# Patient Record
Sex: Female | Born: 1980 | Race: White | Hispanic: No | Marital: Married | Smoking: Never smoker
Health system: Southern US, Community
[De-identification: ages and names within clinical notes are randomized; demographics above are authoritative.]

## PROBLEM LIST (undated history)

## (undated) DIAGNOSIS — R112 Nausea with vomiting, unspecified: Secondary | ICD-10-CM

## (undated) DIAGNOSIS — T753XXA Motion sickness, initial encounter: Secondary | ICD-10-CM

## (undated) DIAGNOSIS — L409 Psoriasis, unspecified: Secondary | ICD-10-CM

## (undated) DIAGNOSIS — G4700 Insomnia, unspecified: Secondary | ICD-10-CM

## (undated) DIAGNOSIS — T7840XA Allergy, unspecified, initial encounter: Secondary | ICD-10-CM

## (undated) DIAGNOSIS — K219 Gastro-esophageal reflux disease without esophagitis: Secondary | ICD-10-CM

## (undated) DIAGNOSIS — Z9889 Other specified postprocedural states: Secondary | ICD-10-CM

## (undated) HISTORY — PX: TUBAL LIGATION: SHX77

## (undated) HISTORY — DX: Allergy, unspecified, initial encounter: T78.40XA

## (undated) HISTORY — DX: Psoriasis, unspecified: L40.9

## (undated) HISTORY — PX: APPENDECTOMY: SHX54

---

## 2004-08-05 ENCOUNTER — Other Ambulatory Visit: Admission: RE | Admit: 2004-08-05 | Discharge: 2004-08-05 | Payer: Self-pay | Admitting: Obstetrics and Gynecology

## 2005-06-04 ENCOUNTER — Other Ambulatory Visit: Admission: RE | Admit: 2005-06-04 | Discharge: 2005-06-04 | Payer: Self-pay | Admitting: Gynecology

## 2005-06-22 ENCOUNTER — Emergency Department: Payer: Self-pay | Admitting: General Practice

## 2005-12-25 ENCOUNTER — Inpatient Hospital Stay (HOSPITAL_COMMUNITY): Admission: AD | Admit: 2005-12-25 | Discharge: 2005-12-28 | Payer: Self-pay | Admitting: Gynecology

## 2006-02-03 ENCOUNTER — Other Ambulatory Visit: Admission: RE | Admit: 2006-02-03 | Discharge: 2006-02-03 | Payer: Self-pay | Admitting: Gynecology

## 2007-07-15 HISTORY — PX: COLONOSCOPY: SHX174

## 2010-07-14 DIAGNOSIS — O00202 Left ovarian pregnancy without intrauterine pregnancy: Secondary | ICD-10-CM

## 2010-07-14 HISTORY — PX: LAPAROSCOPIC BILATERAL SALPINGO OOPHERECTOMY: SHX5890

## 2010-07-14 HISTORY — PX: LAPAROSCOPIC BILATERAL SALPINGECTOMY: SHX5889

## 2010-07-14 HISTORY — DX: Left ovarian pregnancy without intrauterine pregnancy: O00.202

## 2013-07-14 HISTORY — PX: BREAST SURGERY: SHX581

## 2013-07-14 HISTORY — PX: OTHER SURGICAL HISTORY: SHX169

## 2013-07-14 HISTORY — PX: MASTOPEXY: SUR857

## 2013-07-14 HISTORY — PX: ABDOMINOPLASTY: SUR9

## 2015-08-31 ENCOUNTER — Other Ambulatory Visit: Payer: Self-pay | Admitting: Family Medicine

## 2015-08-31 DIAGNOSIS — Z1231 Encounter for screening mammogram for malignant neoplasm of breast: Secondary | ICD-10-CM

## 2015-09-26 ENCOUNTER — Ambulatory Visit: Payer: Self-pay

## 2015-09-26 ENCOUNTER — Ambulatory Visit
Admission: RE | Admit: 2015-09-26 | Discharge: 2015-09-26 | Disposition: A | Discharge status: Home / Self Care | Payer: Managed Care, Other (non HMO) | Source: Ambulatory Visit | Attending: Family Medicine | Admitting: Family Medicine

## 2015-09-26 DIAGNOSIS — Z1231 Encounter for screening mammogram for malignant neoplasm of breast: Secondary | ICD-10-CM | POA: Diagnosis not present

## 2015-09-26 IMAGING — MG MM DIGITAL SCREENING BILAT W/ CAD
4 series · 4 of 4 positions shown · non-contrast
Comparison: None.

CLINICAL DATA: Screening.

EXAM:
DIGITAL SCREENING BILATERAL MAMMOGRAM WITH CAD

[R CC]
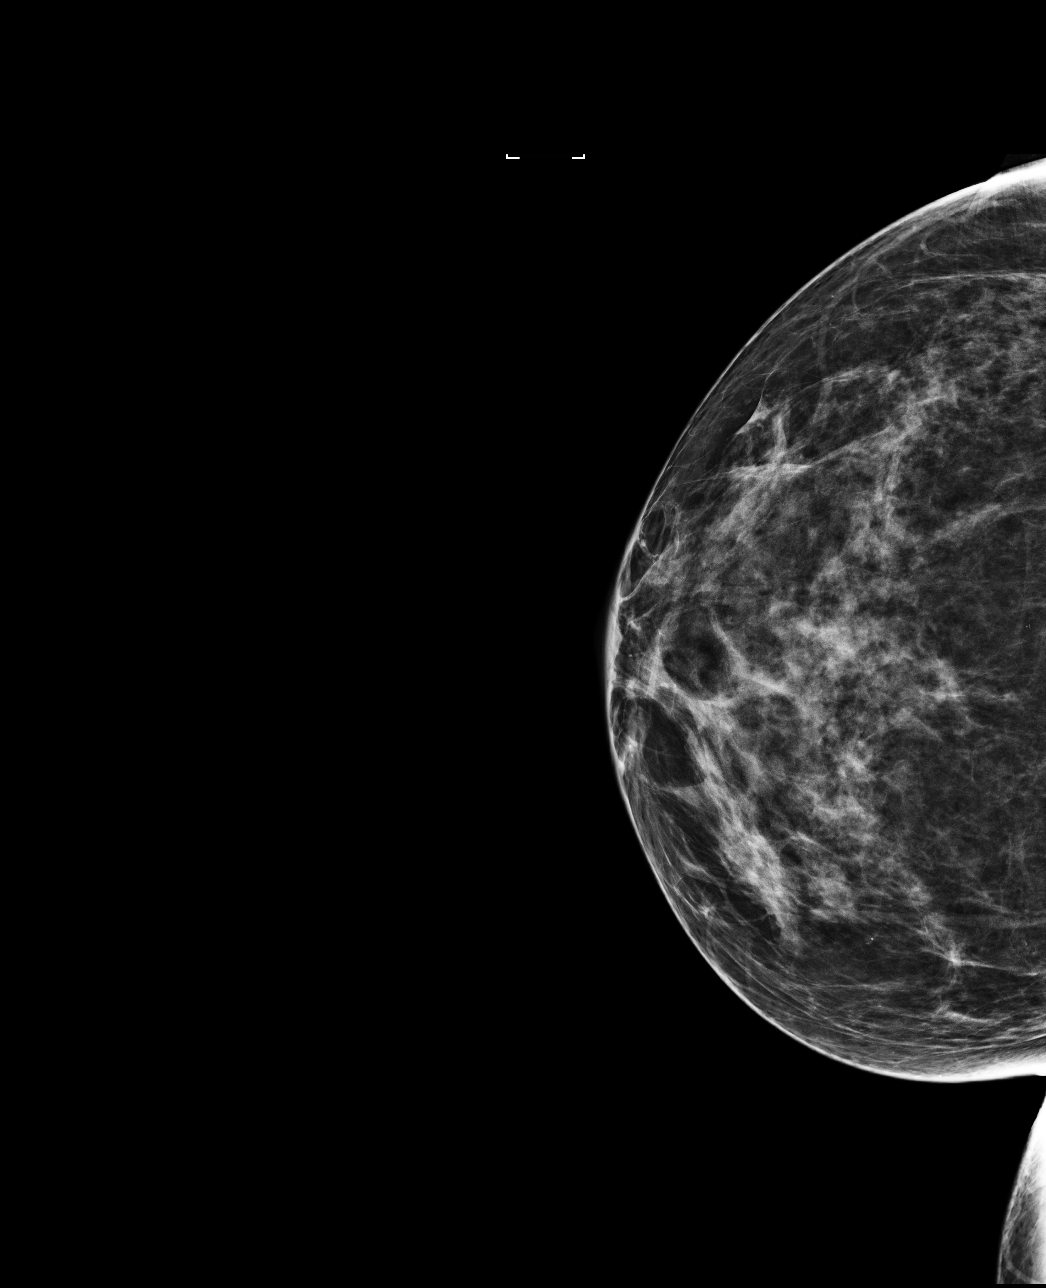

[R MLO]
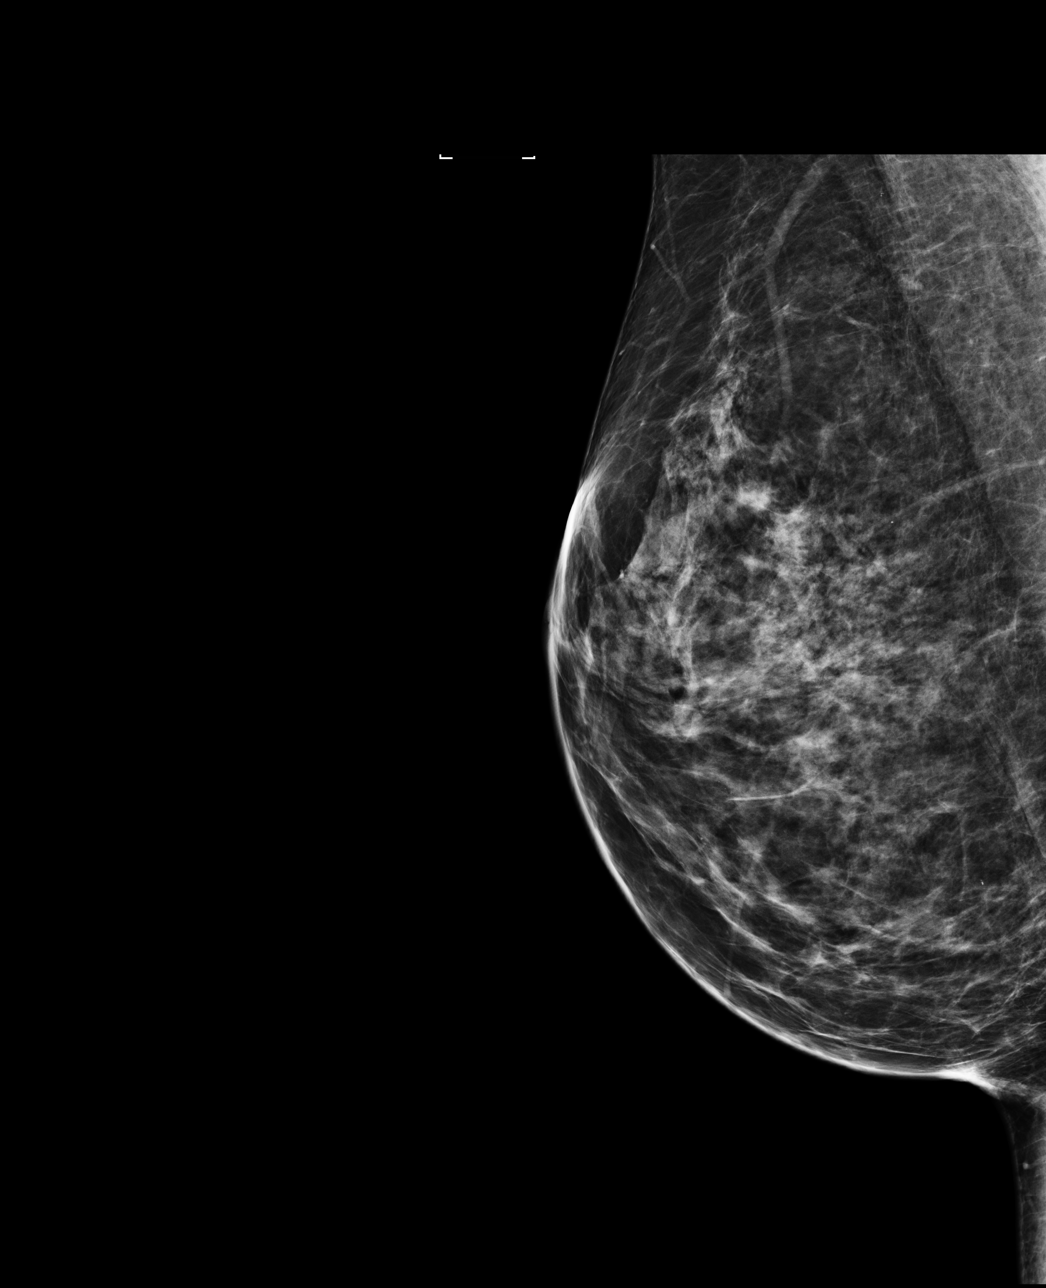

[L CC]
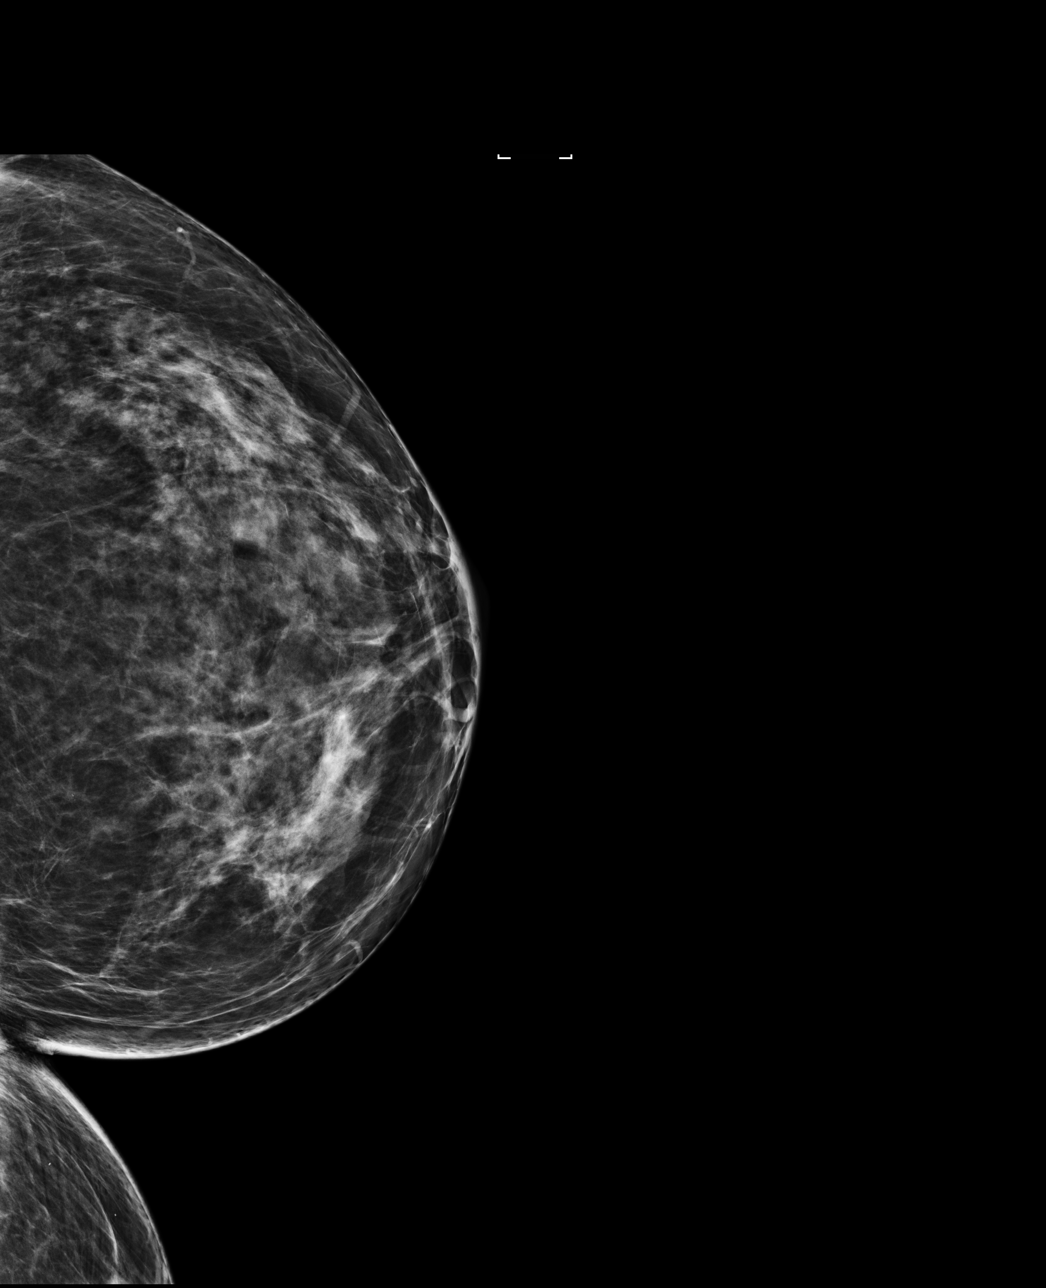

[L MLO]
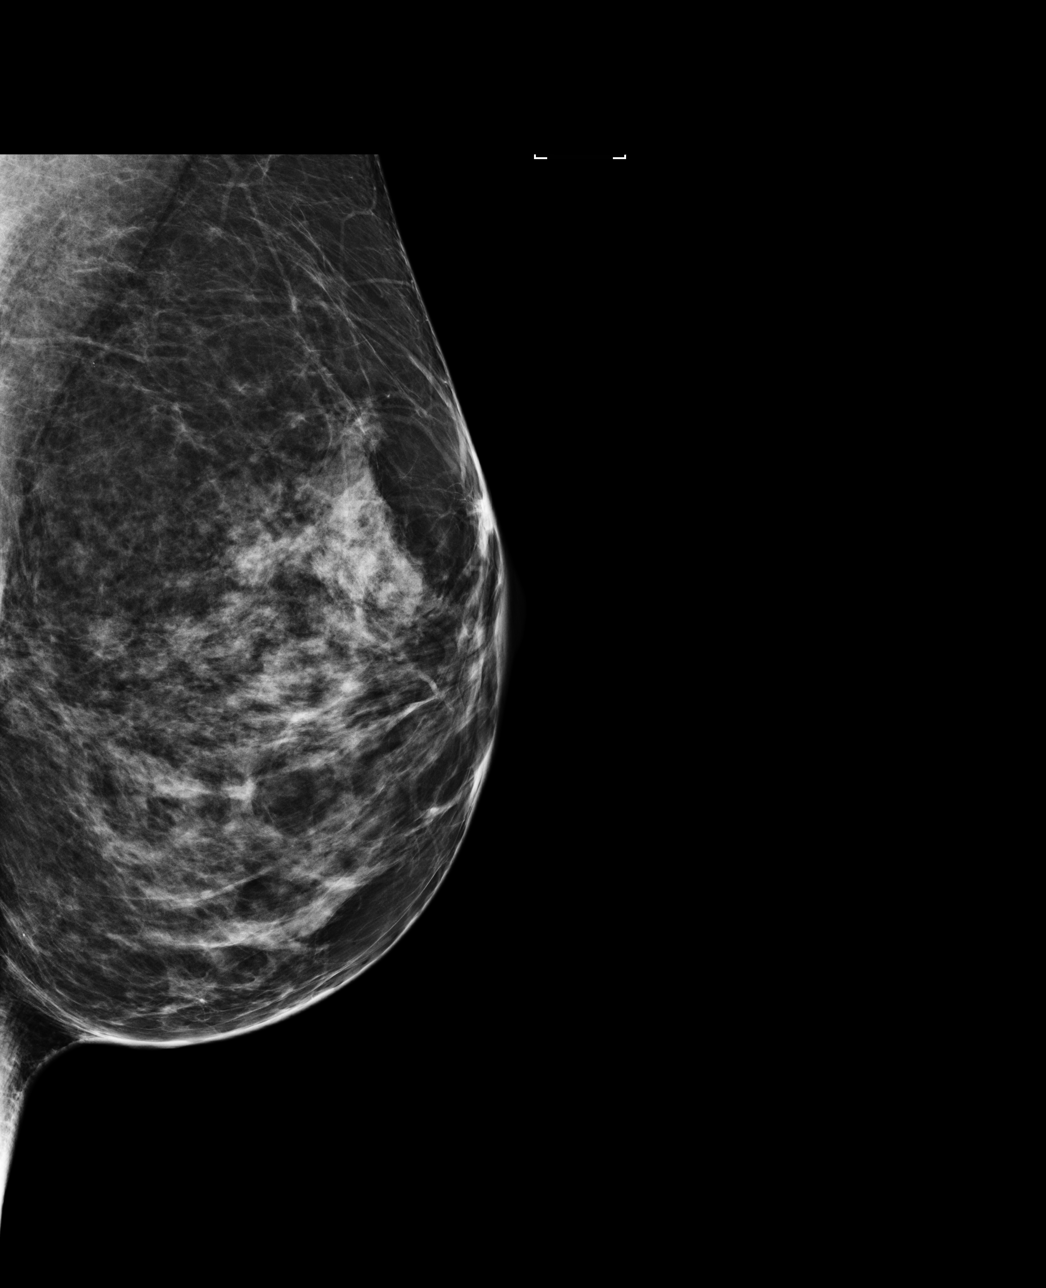

[4 of 4 positions shown; findings below may reference images not displayed]

ACR Breast Density Category b: There are scattered areas of
fibroglandular density.
FINDINGS: There are no findings suspicious for malignancy. Images were
processed with CAD.
IMPRESSION: No mammographic evidence of malignancy. A result letter of this
screening mammogram will be mailed directly to the patient.

RECOMMENDATION:
Screening mammogram at age 40. (Code:[UJ])

BI-RADS CATEGORY  1: Negative.

## 2017-05-13 ENCOUNTER — Ambulatory Visit: Payer: Self-pay | Admitting: Maternal Newborn

## 2017-06-02 NOTE — Progress Notes (Signed)
Gynecology Annual Exam  PCP: Patient, No Pcp Per  Chief Complaint:  Chief Complaint  Patient presents with  . Gynecologic Exam    discuss breakthrough bleeding  . Menorrhagia    History of Present Illness: Patient is a 36 y.o. No obstetric history on file. presents for annual exam. The patient has no complaints today.   LMP: Patient's last menstrual period was 05/15/2017. Average Interval: regular, 28 days Duration of flow: 7 days Heavy Menses: yes Clots: no Intermenstrual Bleeding: no Postcoital Bleeding: no Dysmenorrhea: no  The patient is sexually active. She currently uses tubal ligation for contraception. She denies dyspareunia.  The patient does perform self breast exams.  There is no notable family history of breast or ovarian cancer in her family.  The patient wears seatbelts: yes.   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    The patient denies current symptoms of depression.    Review of Systems: Review of Systems  Constitutional: Negative for chills and fever.  HENT: Negative for congestion.   Respiratory: Negative for cough and shortness of breath.   Cardiovascular: Negative for chest pain and palpitations.  Gastrointestinal: Negative for abdominal pain, constipation, diarrhea, heartburn, nausea and vomiting.  Genitourinary: Negative for dysuria, frequency and urgency.  Skin: Negative for itching and rash.  Neurological: Negative for dizziness and headaches.  Endo/Heme/Allergies: Negative for polydipsia.  Psychiatric/Behavioral: Negative for depression.    Past Medical History:  Past Medical History:  Diagnosis Date  . Psoriasis     Past Surgical History:  Past Surgical History:  Procedure Laterality Date  . APPENDECTOMY    . BREAST SURGERY Bilateral 2015   breast lift  . CESAREAN SECTION  2007, 2009, 2011  . LAPAROSCOPIC BILATERAL SALPINGO OOPHERECTOMY  2012  . TUBAL LIGATION    . tummy tuck  2015     Gynecologic History:  Patient's last menstrual period was 05/15/2017. Contraception: tubal ligation Last Pap: Results were:11/28/2015 NIL and HR HPV negative   Obstetric History: No obstetric history on file.  Family History:  Family History  Problem Relation Age of Onset  . Breast cancer Maternal Aunt 50    Social History:  Social History   Socioeconomic History  . Marital status: Married    Spouse name: Not on file  . Number of children: Not on file  . Years of education: Not on file  . Highest education level: Not on file  Social Needs  . Financial resource strain: Not on file  . Food insecurity - worry: Not on file  . Food insecurity - inability: Not on file  . Transportation needs - medical: Not on file  . Transportation needs - non-medical: Not on file  Occupational History  . Not on file  Tobacco Use  . Smoking status: Never Smoker  . Smokeless tobacco: Never Used  Substance and Sexual Activity  . Alcohol use: Yes  . Drug use: No  . Sexual activity: Yes    Partners: Male    Birth control/protection: None  Other Topics Concern  . Not on file  Social History Narrative  . Not on file    Allergies:  Allergies no known allergies  Medications:  Current Outpatient Medications:  .  CLOBEX 0.05 % shampoo, USE SHAMPOO AS DIRECTED, Disp: , Rfl: 6 .  fluocinonide (LIDEX) 0.05 % external solution, APPLY TWICE DAILY TO AFFECTED AREAS ON SCALP AS NEEDED., Disp: , Rfl:  .  ISOtretinoin (MYORISAN) 40 MG capsule, ,  Disp: , Rfl:  .  meloxicam (MOBIC) 15 MG tablet, Take by mouth., Disp: , Rfl:  .  omeprazole (PRILOSEC) 20 MG capsule, Take by mouth., Disp: , Rfl:   Physical Exam Blood pressure 110/66, pulse 84, height 5\' 4"  (1.626 m), weight 166 lb (75.3 kg), last menstrual period 05/15/2017.  General: NAD HEENT: normocephalic, anicteric Thyroid: no enlargement, no palpable nodules Pulmonary: No increased work of breathing, CTAB Cardiovascular: RRR, distal  pulses 2+ Breast: Breast symmetrical, no tenderness, no palpable nodules or masses, no skin or nipple retraction present, no nipple discharge.  No axillary or supraclavicular lymphadenopathy. Abdomen: NABS, soft, non-tender, non-distended.  Umbilicus without lesions.  No hepatomegaly, splenomegaly or masses palpable. No evidence of hernia  Genitourinary:  External: Normal external female genitalia.  Normal urethral meatus, normal  Bartholin's and Skene's glands.    Vagina: Normal vaginal mucosa, no evidence of prolapse.    Cervix: Grossly normal in appearance, no bleeding  Uterus: Non-enlarged, mobile, normal contour.  No CMT  Adnexa: ovaries non-enlarged, no adnexal masses  Rectal: deferred  Lymphatic: no evidence of inguinal lymphadenopathy Extremities: no edema, erythema, or tenderness Neurologic: Grossly intact Psychiatric: mood appropriate, affect full  Female chaperone present for pelvic and breast  portions of the physical exam    Assessment: 36 y.o. No obstetric history on file. routine annual exam  Plan: Problem List Items Addressed This Visit    None    Visit Diagnoses    Screening for malignant neoplasm of cervix    -  Primary   Relevant Orders   PapIG, HPV, rfx 16/18   Breast screening       Encounter for gynecological examination without abnormal finding          1) STI screening was offered and declined  2) ASCCP guidelines and rational discussed.  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

## 2017-06-03 ENCOUNTER — Encounter: Payer: Self-pay | Admitting: Obstetrics and Gynecology

## 2017-06-03 ENCOUNTER — Ambulatory Visit (INDEPENDENT_AMBULATORY_CARE_PROVIDER_SITE_OTHER): Payer: BC Managed Care – PPO | Admitting: Obstetrics and Gynecology

## 2017-06-03 VITALS — BP 110/66 | HR 84 | Ht 64.0 in | Wt 166.0 lb

## 2017-06-03 DIAGNOSIS — Z1231 Encounter for screening mammogram for malignant neoplasm of breast: Secondary | ICD-10-CM

## 2017-06-03 DIAGNOSIS — Z124 Encounter for screening for malignant neoplasm of cervix: Secondary | ICD-10-CM

## 2017-06-03 DIAGNOSIS — Z1239 Encounter for other screening for malignant neoplasm of breast: Secondary | ICD-10-CM

## 2017-06-03 DIAGNOSIS — Z01419 Encounter for gynecological examination (general) (routine) without abnormal findings: Secondary | ICD-10-CM | POA: Diagnosis not present

## 2017-06-03 NOTE — Patient Instructions (Signed)
The patient was counseled on the overall effectiveness of novasure endometrial ablation in achieving amenorrhea.  She is aware that some patient may continue to have menstrual cycles although these are generally greatly reduced in flow.  In addition she was quoted a failure rate for endometrial ablation of approximately 25% within the frist 4 years, but these failures may happen at any time during or after the initial 4 year postop period.  She is aware that pregnancy is contra-indicated in the setting of prior endometrial ablation, and that ablation itself does not confer any contraceptive benefit.  She will therefore need to continue to rely on some means of contraception following the procedure.  Although rare and generally confined to patient who have undergone prior tubal ligatoin, post-ablation tubal sterilizaton syndrome (PATSS) may also occur with no reliable incidence rates as the majority of published literature is limited to case reports.   Prior to being considered a candidate for Novasure ablation she will need to undergo endometrial biopsy to rule out endometrial hyperplasia or malignancy as the cause of her bleeding, have an up to date pap on record, and undergo transvaginal ultrasound to verify the absence of focal endometrial lesion which may need to be addressed prior to proceeding with ablation.  In addition she is aware that the device is limited for use in women with a normal uterine cavity and uterine leiomyomata <3cm in size.  If present leiomyomata may increase the long-term failure rate of the procedure.  In rare instances the presence of a uterine septum or arcuate uterus, which may not be readily apparent on preoperative ultrasound, may necessitate the procedure to be aborted.    Risks and benefits of IUD discussed including the risks of irregular bleeding, cramping, infection, malpositioning, expulsion or uterine perforation of the IUD (1:1000 placements)  which may require further  procedures such as laparoscopy.  IUDs while effective at preventing pregnancy do not prevent transmission of sexually transmitted diseases and use of barrier methods for this purpose was discussed.  Low overall incidence of failure with 99.7% efficacy rate in typical use.  The patient has not contraindication to IUD placement.

## 2017-06-05 LAB — PAPIG, HPV, RFX 16/18
HPV, high-risk: NEGATIVE
PAP SMEAR COMMENT: 0

## 2017-06-08 DIAGNOSIS — M7581 Other shoulder lesions, right shoulder: Secondary | ICD-10-CM | POA: Insufficient documentation

## 2017-06-08 DIAGNOSIS — S46911A Strain of unspecified muscle, fascia and tendon at shoulder and upper arm level, right arm, initial encounter: Secondary | ICD-10-CM | POA: Insufficient documentation

## 2017-06-08 DIAGNOSIS — S43431A Superior glenoid labrum lesion of right shoulder, initial encounter: Secondary | ICD-10-CM | POA: Insufficient documentation

## 2017-06-12 ENCOUNTER — Other Ambulatory Visit: Payer: Self-pay | Admitting: Surgery

## 2017-06-12 DIAGNOSIS — S46911D Strain of unspecified muscle, fascia and tendon at shoulder and upper arm level, right arm, subsequent encounter: Secondary | ICD-10-CM

## 2017-06-12 DIAGNOSIS — S43431A Superior glenoid labrum lesion of right shoulder, initial encounter: Secondary | ICD-10-CM

## 2017-06-26 ENCOUNTER — Ambulatory Visit: Payer: Managed Care, Other (non HMO)

## 2017-06-26 ENCOUNTER — Ambulatory Visit: Admission: RE | Admit: 2017-06-26 | Payer: BC Managed Care – PPO | Source: Ambulatory Visit

## 2017-06-26 ENCOUNTER — Ambulatory Visit: Payer: BC Managed Care – PPO

## 2017-08-06 ENCOUNTER — Telehealth: Payer: Self-pay

## 2017-08-06 NOTE — Telephone Encounter (Signed)
Pt aware AMS will return to office tomorrow. I advised her I do not see in Epic, or [facility], a rx has ever been written for Diflucan. She states he told her if she ever needed it he would call it in. Pt aware I will send him a message but he still may want her to be seen.

## 2017-08-06 NOTE — Telephone Encounter (Signed)
Valerie Valdez, please advise on this. Pt's last appointment was 05/2017 Thanks

## 2017-08-06 NOTE — Telephone Encounter (Signed)
Called and spoke with Patient about scheduling a medication follow up with Provider. Pt is requesting to speak with a Nurse. Pt states she wa told that Dr. Georgianne Fick told her to let him know when she needs the Diflucan refill to let him know, Please advise. Pt refuses to schedule appt.

## 2017-08-06 NOTE — Telephone Encounter (Signed)
Valerie Valdez please call patient and get her scheduled with a provider, or she can go to urgent care/pcp. AMS is out of the office today.

## 2017-08-06 NOTE — Telephone Encounter (Signed)
Pt would like AMS to call in diflucan to [facility] on [address].  [phone]

## 2017-08-06 NOTE — Telephone Encounter (Signed)
AMS is in office tomorrow Am. Notify pt you will discuss with AMS tomorrow.

## 2017-08-07 ENCOUNTER — Other Ambulatory Visit: Payer: Self-pay | Admitting: Obstetrics and Gynecology

## 2017-08-07 MED ORDER — FLUCONAZOLE 150 MG PO TABS: 150.0000 mg | ORAL_TABLET | Freq: Once | ORAL | 0 refills | Status: AC

## 2017-08-07 NOTE — Telephone Encounter (Signed)
One time dose called in if symptoms persist to come by office for eval

## 2019-05-19 ENCOUNTER — Other Ambulatory Visit: Payer: Self-pay

## 2019-05-19 DIAGNOSIS — Z20822 Contact with and (suspected) exposure to covid-19: Secondary | ICD-10-CM

## 2019-05-21 LAB — NOVEL CORONAVIRUS, NAA: SARS-CoV-2, NAA: NOT DETECTED

## 2019-05-26 ENCOUNTER — Other Ambulatory Visit: Payer: Self-pay

## 2019-05-26 DIAGNOSIS — Z20822 Contact with and (suspected) exposure to covid-19: Secondary | ICD-10-CM

## 2019-05-28 LAB — NOVEL CORONAVIRUS, NAA: SARS-CoV-2, NAA: NOT DETECTED

## 2019-09-09 ENCOUNTER — Other Ambulatory Visit: Payer: Self-pay

## 2019-09-09 ENCOUNTER — Ambulatory Visit: Payer: BC Managed Care – PPO | Attending: Internal Medicine

## 2019-09-09 DIAGNOSIS — Z23 Encounter for immunization: Secondary | ICD-10-CM | POA: Insufficient documentation

## 2019-09-09 NOTE — Progress Notes (Signed)
   Covid-19 Vaccination Clinic  Name:  Valerie Valdez    MRN: [id number] DOB: 19-Aug-1980  09/09/2019  [name] was observed post Covid-19 immunization for 15 minutes without incidence. She was provided with Vaccine Information Sheet and instruction to access the V-Safe system.   [name] was instructed to call 911 with any severe reactions post vaccine: Marland Kitchen Difficulty breathing  . Swelling of your face and throat  . A fast heartbeat  . A bad rash all over your body  . Dizziness and weakness    Immunizations Administered    Name Date Dose VIS Date Route   Pfizer COVID-19 Vaccine 09/09/2019 10:07 AM 0.3 mL 06/24/2019 Intramuscular   Manufacturer: [place]   Lot: [id number]   [place]: [id number]

## 2019-10-04 ENCOUNTER — Ambulatory Visit: Payer: BC Managed Care – PPO | Attending: Internal Medicine

## 2019-10-04 DIAGNOSIS — Z23 Encounter for immunization: Secondary | ICD-10-CM

## 2019-10-04 NOTE — Progress Notes (Signed)
   Covid-19 Vaccination Clinic  Name:  Valerie Valdez    MRN: [id number] DOB: [birth year]  10/04/2019  Ms. Loughman was observed post Covid-19 immunization for 15 minutes without incident. She was provided with Vaccine Information Sheet and instruction to access the V-Safe system.   Ms. Gromley was instructed to call 911 with any severe reactions post vaccine: Marland Kitchen Difficulty breathing  . Swelling of face and throat  . A fast heartbeat  . A bad rash all over body  . Dizziness and weakness   Immunizations Administered    Name Date Dose VIS Date Route   Pfizer COVID-19 Vaccine 10/04/2019  2:46 PM 0.3 mL 06/24/2019 Intramuscular   Manufacturer: [place]   Lot: [id number]   [place]: [id number]

## 2020-02-07 ENCOUNTER — Telehealth: Payer: Self-pay

## 2020-02-07 NOTE — Telephone Encounter (Signed)
Pt calling; has appt on 02/22/20; having pelvic pain that keeps getting worse.  What can she do for the pain in the meantime?  [id number] Adv 600mg  IBU q6h or 800mg  q8h; heating pad 38min qhr while awake or may try cool compress.  Pt will call back if doesn't help.  Pt would like to be put on the cancellation list.  Will send to front desk.

## 2020-02-07 NOTE — Telephone Encounter (Signed)
Patient added to cancellation list.

## 2020-02-22 ENCOUNTER — Other Ambulatory Visit: Payer: Self-pay

## 2020-02-22 ENCOUNTER — Encounter: Payer: Self-pay | Admitting: Obstetrics and Gynecology

## 2020-02-22 ENCOUNTER — Ambulatory Visit (INDEPENDENT_AMBULATORY_CARE_PROVIDER_SITE_OTHER): Payer: BC Managed Care – PPO | Admitting: Obstetrics and Gynecology

## 2020-02-22 ENCOUNTER — Other Ambulatory Visit (HOSPITAL_COMMUNITY)
Admission: RE | Admit: 2020-02-22 | Discharge: 2020-02-22 | Disposition: A | Discharge status: Home / Self Care | Payer: BC Managed Care – PPO | Source: Ambulatory Visit | Attending: Obstetrics and Gynecology | Admitting: Obstetrics and Gynecology

## 2020-02-22 VITALS — BP 121/81 | HR 71 | Ht 64.0 in | Wt 170.0 lb

## 2020-02-22 DIAGNOSIS — Z01419 Encounter for gynecological examination (general) (routine) without abnormal findings: Secondary | ICD-10-CM

## 2020-02-22 DIAGNOSIS — Z124 Encounter for screening for malignant neoplasm of cervix: Secondary | ICD-10-CM | POA: Diagnosis not present

## 2020-02-22 DIAGNOSIS — N939 Abnormal uterine and vaginal bleeding, unspecified: Secondary | ICD-10-CM | POA: Insufficient documentation

## 2020-02-22 DIAGNOSIS — Z1239 Encounter for other screening for malignant neoplasm of breast: Secondary | ICD-10-CM

## 2020-02-22 NOTE — Progress Notes (Signed)
Gynecology Annual Exam   PCP: Maryland Pink, MD  Chief Complaint:  Chief Complaint  Patient presents with  . Gynecologic Exam    breakthrough bleeding, discuss possible ablation    History of Present Illness: Patient is a 39 y.o. [id number] presents for annual exam. The patient has no complaints today.   LMP: Patient's last menstrual period was 02/17/2020. Average Interval: regular, 28 days Duration of flow: 5 days Heavy Menses: no Clots: no Intermenstrual Bleeding: yes consistent midcycle bleeding Postcoital Bleeding: no Dysmenorrhea: no  The patient is sexually active. She currently uses tubal ligation for contraception. She denies dyspareunia.  The patient does perform self breast exams.  There is no notable family history of breast or ovarian cancer in her family.  The patient wears seatbelts: yes.   The patient has regular exercise: not asked.    The patient denies current symptoms of depression.    Review of Systems: Review of Systems  Constitutional: Negative for chills and fever.  HENT: Negative for congestion.   Respiratory: Negative for cough and shortness of breath.   Cardiovascular: Negative for chest pain and palpitations.  Gastrointestinal: Negative for abdominal pain, constipation, diarrhea, heartburn, nausea and vomiting.  Genitourinary: Negative for dysuria, frequency and urgency.  Skin: Negative for itching and rash.  Neurological: Negative for dizziness and headaches.  Endo/Heme/Allergies: Negative for polydipsia.  Psychiatric/Behavioral: Negative for depression.    Past Medical History:  There are no problems to display for this patient.   Past Surgical History:  Past Surgical History:  Procedure Laterality Date  . APPENDECTOMY    . BREAST SURGERY Bilateral 2015   breast lift  . CESAREAN SECTION  2007, 2009, 2011  . LAPAROSCOPIC BILATERAL SALPINGO OOPHERECTOMY  2012  . TUBAL LIGATION    . tummy tuck  2015    Gynecologic History:    Patient's last menstrual period was 02/17/2020. Contraception: tubal ligation Last Pap: Results were:06/03/2017 NIL and HR HPV negative   Obstetric History: [id number]  Family History:  Family History  Problem Relation Age of Onset  . Breast cancer Maternal Aunt 50    Social History:  Social History   Socioeconomic History  . Marital status: Married    Spouse name: Not on file  . Number of children: Not on file  . Years of education: Not on file  . Highest education level: Not on file  Occupational History  . Not on file  Tobacco Use  . Smoking status: Never Smoker  . Smokeless tobacco: Never Used  Vaping Use  . Vaping Use: Never used  Substance and Sexual Activity  . Alcohol use: Yes    Comment: Occ  . Drug use: No  . Sexual activity: Yes    Partners: Male    Birth control/protection: None  Other Topics Concern  . Not on file  Social History Narrative  . Not on file   Social Determinants of Health   Financial Resource Strain:   . Difficulty of Paying Living Expenses:   Food Insecurity:   . Worried About Charity fundraiser in the Last Year:   . Arboriculturist in the Last Year:   Transportation Needs:   . Film/video editor (Medical):   Marland Kitchen Lack of Transportation (Non-Medical):   Physical Activity:   . Days of Exercise per Week:   . Minutes of Exercise per Session:   Stress:   . Feeling of Stress :   Social Connections:   . Frequency  of Communication with Friends and Family:   . Frequency of Social Gatherings with Friends and Family:   . Attends Religious Services:   . Active Member of Clubs or Organizations:   . Attends Archivist Meetings:   Marland Kitchen Marital Status:   Intimate Partner Violence:   . Fear of Current or Ex-Partner:   . Emotionally Abused:   Marland Kitchen Physically Abused:   . Sexually Abused:     Allergies:  No Known Allergies  Medications: Prior to Admission medications   Medication Sig Start Date End Date Taking? Authorizing  Provider  Clobetasol Propionate (TEMOVATE) 0.05 % external spray APPLY NIGHTLY TO SCALP 08/15/19  Yes [provider]  Clobetasol Propionate 0.05 % shampoo Use as directed as shampoo 01/15/18  Yes [provider]  CLOBEX 0.05 % shampoo USE SHAMPOO AS DIRECTED 05/25/17  Yes [provider]  doxycycline (VIBRAMYCIN) 100 MG capsule Take 100 mg by mouth daily. 08/28/19  Yes [provider]  fluocinonide (LIDEX) 0.05 % external solution APPLY TWICE DAILY TO AFFECTED AREAS ON SCALP AS NEEDED. 04/29/17  Yes [provider]  ISOtretinoin (MYORISAN) 40 MG capsule  05/19/17  Yes [provider]  ketoconazole (NIZORAL) 2 % shampoo USE AS SHAMPOO EVERY OTHER DAY. LEAVE ON FOR 5 MINUTES BEFORE RINSING. 04/13/18  Yes [provider]  traZODone (DESYREL) 50 MG tablet TAKE 1 TABLET BY MOUTH EVERY DAY AT NIGHT 02/07/20  Yes [provider]  omeprazole (PRILOSEC) 20 MG capsule Take by mouth. 09/09/16 09/09/17  [provider]    Physical Exam Vitals: Blood pressure 121/81, pulse 71, height 5\' 4"  (1.626 m), weight 170 lb (77.1 kg), last menstrual period 02/17/2020.  General: NAD, well nourished, appears stated age 79: normocephalic, anicteric Thyroid: no enlargement, no palpable nodules Pulmonary: No increased work of breathing, CTAB Cardiovascular: RRR, distal pulses 2+ Breast: Breast symmetrical, no tenderness, no palpable nodules or masses, no skin or nipple retraction present, no nipple discharge.  No axillary or supraclavicular lymphadenopathy. Abdomen: NABS, soft, non-tender, non-distended.  Umbilicus without lesions.  No hepatomegaly, splenomegaly or masses palpable. No evidence of hernia  Genitourinary:  External: Normal external female genitalia.  Normal urethral meatus, normal Bartholin's and Skene's glands.    Vagina: Normal vaginal mucosa, no evidence of prolapse.    Cervix: Grossly normal in appearance, no bleeding  Uterus:  Non-enlarged, mobile, normal contour.  No CMT  Adnexa: ovaries non-enlarged, no adnexal masses  Rectal: deferred  Lymphatic: no evidence of inguinal lymphadenopathy Extremities: no edema, erythema, or tenderness Neurologic: Grossly intact Psychiatric: mood appropriate, affect full  Female chaperone present for pelvic and breast  portions of the physical exam  Immunization History  Administered Date(s) Administered  . Influenza-Unspecified 05/27/2017  . PFIZER SARS-COV-2 Vaccination 09/09/2019, 10/04/2019     Assessment: 39 y.o. G4P0013 routine annual exam  Plan: Problem List Items Addressed This Visit    None    Visit Diagnoses    Abnormal uterine bleeding    -  Primary   Relevant Orders   Cytology - PAP   US Transvaginal Non-OB   Encounter for gynecological examination without abnormal finding       Screening for malignant neoplasm of cervix       Breast screening          1) STI screening  was notoffered and therefore not obtained  2)  ASCCP guidelines and rational discussed.  Patient opts for every 3 years screening interval  3) Contraception - the patient  is currently using  tubal ligation.  She is happy with her current form of contraception and plans to continue  4) Routine healthcare maintenance including cholesterol, diabetes screening discussed managed by PCP  5) AUB - TVUS ordered, endometrial biopsy with ultrasound if no focal lesion.  Patient is interested in Novasure abaltion.  Discussed Mirena as an alternative and the fact that should she be unresponsive to Mirena can proceed with ablation but the opposite is not the case as the ablation generally precludes ability to place IUD.  She would like to avoid hormones if at all possible.  6)  Return in about 1 week (around 02/29/2020) for 1-2 week TVUS and endometrial biopsy.   Malachy Mood, MD, [facility], [facility] 02/22/2020, 12:22 PM

## 2020-02-24 LAB — CYTOLOGY - PAP
Comment: NEGATIVE
Diagnosis: UNDETERMINED — AB
High risk HPV: NEGATIVE

## 2020-03-12 ENCOUNTER — Encounter: Payer: Self-pay | Admitting: Obstetrics and Gynecology

## 2020-03-12 ENCOUNTER — Ambulatory Visit (INDEPENDENT_AMBULATORY_CARE_PROVIDER_SITE_OTHER): Payer: BC Managed Care – PPO | Admitting: Obstetrics and Gynecology

## 2020-03-12 ENCOUNTER — Other Ambulatory Visit: Payer: Self-pay

## 2020-03-12 ENCOUNTER — Ambulatory Visit (INDEPENDENT_AMBULATORY_CARE_PROVIDER_SITE_OTHER): Payer: BC Managed Care – PPO

## 2020-03-12 VITALS — BP 120/84 | HR 54 | Ht 64.0 in | Wt 171.0 lb

## 2020-03-12 DIAGNOSIS — N939 Abnormal uterine and vaginal bleeding, unspecified: Secondary | ICD-10-CM

## 2020-03-12 NOTE — Progress Notes (Signed)
Gynecology Ultrasound Follow Up  Chief Complaint:  Chief Complaint  Patient presents with  . Follow-up    GYN ultrasound     History of Present Illness: Patient is a 39 y.o. female who presents today for ultrasound evaluation of AUB  Ultrasound demonstrates the following findgins Adnexa: no masses seen  Uterus: Enlarged with multiple uterine fibroids with endometrial stripe appears homogenous without evidence of focal endometrial lesion Additional: no free fluid  Review of Systems: Review of Systems  Constitutional: Negative.   Gastrointestinal: Negative.   Genitourinary: Negative.     Past Medical History:  Past Medical History:  Diagnosis Date  . Ectopic pregnancy of left ovary 2012  . Psoriasis     Past Surgical History:  Past Surgical History:  Procedure Laterality Date  . APPENDECTOMY    . BREAST SURGERY Bilateral 2015   breast lift  . CESAREAN SECTION  2007, 2009, 2011  . LAPAROSCOPIC BILATERAL SALPINGO OOPHERECTOMY  2012  . TUBAL LIGATION    . tummy tuck  2015    Gynecologic History:  Patient's last menstrual period was 02/17/2020. Contraception: tubal ligation Last Pap: 02/22/2020 Results were: .ASCUS with NEGATIVE high risk HPV  Family History:  Family History  Problem Relation Age of Onset  . Breast cancer Maternal Aunt 50    Social History:  Social History   Socioeconomic History  . Marital status: Married    Spouse name: Not on file  . Number of children: Not on file  . Years of education: Not on file  . Highest education level: Not on file  Occupational History  . Not on file  Tobacco Use  . Smoking status: Never Smoker  . Smokeless tobacco: Never Used  Vaping Use  . Vaping Use: Never used  Substance and Sexual Activity  . Alcohol use: Yes    Comment: Occ  . Drug use: No  . Sexual activity: Yes    Partners: Male    Birth control/protection: None  Other Topics Concern  . Not on file  Social History Narrative  . Not on file    Social Determinants of Health   Financial Resource Strain:   . Difficulty of Paying Living Expenses: Not on file  Food Insecurity:   . Worried About Charity fundraiser in the Last Year: Not on file  . Ran Out of Food in the Last Year: Not on file  Transportation Needs:   . Lack of Transportation (Medical): Not on file  . Lack of Transportation (Non-Medical): Not on file  Physical Activity:   . Days of Exercise per Week: Not on file  . Minutes of Exercise per Session: Not on file  Stress:   . Feeling of Stress : Not on file  Social Connections:   . Frequency of Communication with Friends and Family: Not on file  . Frequency of Social Gatherings with Friends and Family: Not on file  . Attends Religious Services: Not on file  . Active Member of Clubs or Organizations: Not on file  . Attends Archivist Meetings: Not on file  . Marital Status: Not on file  Intimate Partner Violence:   . Fear of Current or Ex-Partner: Not on file  . Emotionally Abused: Not on file  . Physically Abused: Not on file  . Sexually Abused: Not on file    Allergies:  No Known Allergies  Medications: Prior to Admission medications   Medication Sig Start Date End Date Taking? Authorizing Provider  Clobetasol  Propionate (TEMOVATE) 0.05 % external spray APPLY NIGHTLY TO SCALP 08/15/19  Yes [provider]  Clobetasol Propionate 0.05 % shampoo Use as directed as shampoo 01/15/18  Yes [provider]  CLOBEX 0.05 % shampoo USE SHAMPOO AS DIRECTED 05/25/17  Yes [provider]  doxycycline (VIBRAMYCIN) 100 MG capsule Take 100 mg by mouth daily. 08/28/19  Yes [provider]  fluocinonide (LIDEX) 0.05 % external solution APPLY TWICE DAILY TO AFFECTED AREAS ON SCALP AS NEEDED. 04/29/17  Yes [provider]  ISOtretinoin (MYORISAN) 40 MG capsule  05/19/17  Yes [provider]  ketoconazole (NIZORAL) 2 % shampoo USE AS SHAMPOO EVERY OTHER DAY. LEAVE ON  FOR 5 MINUTES BEFORE RINSING. 04/13/18  Yes [provider]  traZODone (DESYREL) 50 MG tablet TAKE 1 TABLET BY MOUTH EVERY DAY AT NIGHT 02/07/20  Yes [provider]  omeprazole (PRILOSEC) 20 MG capsule Take by mouth. 09/09/16 09/09/17  [provider]    Physical Exam Vitals: Blood pressure 120/84, pulse (!) 54, height 5\' 4"  (1.626 m), weight 171 lb (77.6 kg), last menstrual period 02/17/2020.  General: NAD HEENT: normocephalic, anicteric Pulmonary: No increased work of breathing Extremities: no edema, erythema, or tenderness Neurologic: Grossly intact, normal gait Psychiatric: mood appropriate, affect full   Assessment: 39 y.o. [id number] No problem-specific Assessment & Plan notes found for this encounter.   Plan: Problem List Items Addressed This Visit    None    Visit Diagnoses    Abnormal uterine bleeding    -  Primary      1) AUB-L Discussed hormonal options, Mirena, ablation, and hysterectomy.  Would like to avoid hormones.  With fibroids failure rate of ablation likely higher than 27%.  Considering hysterectomy but wants to contemplate options.  2) A total of 15 minutes were spent in face-to-face contact with the patient during this encounter with over half of that time devoted to counseling and coordination of care.  3) Return if symptoms worsen or fail to improve, otherwise 1 year annual.    Malachy Mood, MD, [place], [place] 03/12/2020, 4:54 PM

## 2020-03-14 ENCOUNTER — Telehealth: Payer: Self-pay | Admitting: Obstetrics and Gynecology

## 2020-03-14 NOTE — Telephone Encounter (Signed)
I rec'd a call from pt regarding her desire to move forward with having the hysterectomy you discussed on 8/30. She was asking if you felt she would be ok with waiting until Jan 2022 to have the surgery. She wasn't sure if the fibroid growth would effect things too much.  She had questions about her insurance, costs for surgery, recovery etc. I was able to address the insurance and cost questions adv her that the info I provided was not exact but rather a better understanding of how her plan would potentially pay. I also explained that charges are incurred not only from the provider but also the hospital, lab, anes, etc. She understood.  I adv that I would pass her questions to you and that she would hear back from you or from someone in our office.

## 2020-03-15 NOTE — Telephone Encounter (Signed)
Yes January is fine

## 2020-03-21 NOTE — Telephone Encounter (Signed)
Spoke to pt to adv that AMS said waiting until January is fine.  Pt asked if something changes with ABSS and kids go virtual, could she go ahead and schedule something for this year. I adv that we can look at AMS schedule and see what's available but usually I can fit it in within a few weeks.  As of now, I will call pt in early December to schedule surgery for Jan.

## 2020-05-14 ENCOUNTER — Telehealth: Payer: Self-pay | Admitting: Obstetrics and Gynecology

## 2020-05-14 NOTE — Telephone Encounter (Signed)
Called patient to schedule TLH/BS  DOS 2/17   H&P 2/10 @ 4:10   Covid testing 2/15 @ 8-10:30, [facility] [employer], drive up and wear mask. Advised pt to quarantine until DOS.  Pre-admit phone call appointment to be requested - date and time will be included on H&P paper work. Also all appointments will be updated on pt MyChart. Explained that this appointment has a call window. Based on the time scheduled will indicate if the call will be received within a 4 hour window before 1:00 or after.  Advised that pt may also receive calls from the hospital pharmacy and pre-service center.  Confirmed pt has BCBS as Chartered certified accountant. No secondary insurance.

## 2020-05-14 NOTE — Telephone Encounter (Signed)
-----   Message from Malachy Mood, MD sent at 03/29/2020  8:36 PM EDT ----- Regarding: Surgery Surgery Booking Request Patient Full Name:  Valerie Valdez  MRN: [id number]  DOB: 1980-11-27  Surgeon: Malachy Mood, MD  Requested Surgery Date and Time: 4-6 weeks Primary Diagnosis AND Code: AUB Secondary Diagnosis and Code:  Uterine fibroids Surgical Procedure: TLH/BS RNFA Requested?: No L&D Notification: No Admission Status: surgery admit Length of Surgery: 125 min Special Case Needs: No H&P: Yes Phone Interview???:  No Interpreter: No Medical Clearance:  No Special Scheduling Instructions: Yes (Given size of fibroid there is a possibility of conversion to laparotomy and an overnight stay.  So this would need to wait until we are able to post inpatient surgeries again) Any known health/anesthesia issues, diabetes, sleep apnea, latex allergy, defibrillator/pacemaker?: No Acuity: P3   (P1 highest, P2 delay may cause harm, P3 low, elective gyn, P4 lowest)

## 2020-05-21 ENCOUNTER — Telehealth: Payer: Self-pay | Admitting: Obstetrics and Gynecology

## 2020-05-21 NOTE — Telephone Encounter (Signed)
Pt called and wanted to move her surgery back a week, preferring a Thursday. The next week AMS was in OR on Tues so she decided on 09/13/20 for new DOS. I adv that Covid testing will be 3/1 @ 8-10:30. The PAT appt will update to her my chart. H&P is now 2/21 at 1:50.

## 2020-08-20 ENCOUNTER — Other Ambulatory Visit: Payer: BC Managed Care – PPO

## 2020-08-23 ENCOUNTER — Encounter: Payer: BC Managed Care – PPO | Admitting: Obstetrics and Gynecology

## 2020-08-28 ENCOUNTER — Other Ambulatory Visit: Payer: BC Managed Care – PPO

## 2020-09-03 ENCOUNTER — Ambulatory Visit (INDEPENDENT_AMBULATORY_CARE_PROVIDER_SITE_OTHER): Payer: BC Managed Care – PPO | Admitting: Obstetrics and Gynecology

## 2020-09-03 ENCOUNTER — Encounter: Payer: Self-pay | Admitting: Obstetrics and Gynecology

## 2020-09-03 ENCOUNTER — Other Ambulatory Visit: Payer: Self-pay

## 2020-09-03 VITALS — BP 118/62 | Ht 64.0 in | Wt 170.0 lb

## 2020-09-03 DIAGNOSIS — N939 Abnormal uterine and vaginal bleeding, unspecified: Secondary | ICD-10-CM

## 2020-09-03 DIAGNOSIS — Z01818 Encounter for other preprocedural examination: Secondary | ICD-10-CM

## 2020-09-03 NOTE — Progress Notes (Signed)
Obstetrics & Gynecology Surgery H&P    Chief Complaint: Scheduled Surgery   History of Present Illness: Patient is a 40 y.o. [id number] presenting for scheduled TLH, BS, cystoscopy, for the treatment or further evaluation of AUB.   Prior Treatments prior to proceeding with surgery include: discussion of hormonal treatments, endometrial ablation, IUD  Preoperative Pap: 02/22/2020  Results: ASCUS with NEGATIVE high risk HPV  Preoperative Endometrial biopsy: N/A Preoperative Ultrasound: 03/12/2020 Findings: Small 12.1 6.3 x 6.9cm intramural posterior fibroid. Several other small fibroids..  Normal ovaries.     Review of Systems:10 point review of systems  Past Medical History:  There are no problems to display for this patient.   Past Surgical History:  Past Surgical History:  Procedure Laterality Date  . APPENDECTOMY    . BREAST SURGERY Bilateral 2015   breast lift  . CESAREAN SECTION  2007, 2009, 2011  . LAPAROSCOPIC BILATERAL SALPINGO OOPHERECTOMY  2012  . TUBAL LIGATION    . tummy tuck  2015    Family History:  Family History  Problem Relation Age of Onset  . Breast cancer Maternal Aunt 50    Social History:  Social History   Socioeconomic History  . Marital status: Married    Spouse name: Not on file  . Number of children: Not on file  . Years of education: Not on file  . Highest education level: Not on file  Occupational History  . Not on file  Tobacco Use  . Smoking status: Never Smoker  . Smokeless tobacco: Never Used  Vaping Use  . Vaping Use: Never used  Substance and Sexual Activity  . Alcohol use: Yes    Comment: Occ  . Drug use: No  . Sexual activity: Yes    Partners: Male    Birth control/protection: None  Other Topics Concern  . Not on file  Social History Narrative  . Not on file   Social Determinants of Health   Financial Resource Strain: Not on file  Food Insecurity: Not on file  Transportation Needs: Not on file  Physical  Activity: Not on file  Stress: Not on file  Social Connections: Not on file  Intimate Partner Violence: Not on file    Allergies:  No Known Allergies  Medications: Prior to Admission medications   Medication Sig Start Date End Date Taking? Authorizing Provider  fluocinonide (LIDEX) 0.05 % external solution Apply 1 application topically 2 (two) times daily as needed (scalp). 04/29/17  Yes [provider]  ketoconazole (NIZORAL) 2 % shampoo Apply 1 application topically daily as needed for irritation. 04/13/18  Yes [provider]  Multiple Vitamins-Minerals (MULTIVITAMIN WITH MINERALS) tablet Take 1 tablet by mouth daily.   Yes [provider]  omeprazole (PRILOSEC) 20 MG capsule Take 20 mg by mouth daily as needed (acid reflux). 09/09/16  Yes [provider]  traZODone (DESYREL) 50 MG tablet Take 50 mg by mouth at bedtime. 02/07/20  Yes [provider]    Physical Exam Vitals: Blood pressure 118/62, height 5\' 4"  (1.626 m), weight 170 lb (77.1 kg), last menstrual period 08/25/2020.  General: NAD HEENT: normocephalic, anicteric Pulmonary: No increased work of breathing, CTAB Cardiovascular: RRR, distal pulses 2+ Abdomen: soft, non-tender, non-distended Genitourinary: deferred Extremities: no edema, erythema, or tenderness Neurologic: Grossly intact Psychiatric: mood appropriate, affect full  Imaging No results found.  Assessment: 40 y.o. [id number] presenting for scheduled TLH, BS, cystoscopy  Plan: 1) Patient opts for definitive surgical management via hysterectomy. The risks  of surgery were discussed in detail with the patient including but not limited to: bleeding which may require transfusion or reoperation; infection which may require antibiotics; injury to bowel, bladder, ureters or other surrounding organs (With a literature reported rate of urinary tract injury of 1% quoted); need for additional procedures including laparotomy;  thromboembolic phenomenon, incisional problems and other postoperative/anesthesia complications.  Patient was also advised that recovery procedure generally involves an overnight stay; and the  expected recovery time after a hysterectomy being in the range of 6-8 weeks.  Likelihood of success in alleviating the patient's symptoms was discussed.  While definitive in regards to issues with menstural bleeding, pelvic pain if present preoperatively may continue and in fact worsen postoperatively.  She is aware that the procedure will render her unable to pursue childbearing in the future.   She was told that she will be contacted by our surgical scheduler regarding the time and date of her surgery; routine preoperative instructions of having nothing to eat or drink after midnight on the day prior to surgery and also coming to the hospital 1.5 hours prior to her time of surgery were also emphasized.  She was told she may be called for a preoperative appointment about a week prior to surgery and will be given further preoperative instructions at that visit.  Routine postoperative instructions will be reviewed with the patient and her family in detail after surgery. Printed patient education handouts about the procedure was given to the patient to review at home. - has had prior abdomina plasty for rectus diastasis consider [place] entry does not recall mesh being used - status post prior salpingectomy as well per report  2) Routine postoperative instructions were reviewed with the patient and her family in detail today including the expected length of recovery and likely postoperative course.  The patient concurred with the proposed plan, giving informed written consent for the surgery today.  Patient instructed on the importance of being NPO after midnight prior to her procedure.  If warranted preoperative prophylactic antibiotics and SCDs ordered on call to the OR to meet SCIP guidelines and adhere to  recommendation laid forth in [place] Number 104 May 2009  "Antibiotic Prophylaxis for Gynecologic Procedures".    3) Return in about 17 days (around 09/20/2020) for follow postop.    Malachy Mood, MD, [facility], [facility] 09/03/2020, 2:42 PM

## 2020-09-03 NOTE — H&P (View-Only) (Signed)
Obstetrics & Gynecology Surgery H&P    Chief Complaint: Scheduled Surgery   History of Present Illness: Patient is a 40 y.o. [id number] presenting for scheduled TLH, BS, cystoscopy, for the treatment or further evaluation of AUB.   Prior Treatments prior to proceeding with surgery include: discussion of hormonal treatments, endometrial ablation, IUD  Preoperative Pap: 02/22/2020  Results: ASCUS with NEGATIVE high risk HPV  Preoperative Endometrial biopsy: N/A Preoperative Ultrasound: 03/12/2020 Findings: Small 12.1 6.3 x 6.9cm intramural posterior fibroid. Several other small fibroids..  Normal ovaries.     Review of Systems:10 point review of systems  Past Medical History:  There are no problems to display for this patient.   Past Surgical History:  Past Surgical History:  Procedure Laterality Date  . APPENDECTOMY    . BREAST SURGERY Bilateral 2015   breast lift  . CESAREAN SECTION  2007, 2009, 2011  . LAPAROSCOPIC BILATERAL SALPINGO OOPHERECTOMY  2012  . TUBAL LIGATION    . tummy tuck  2015    Family History:  Family History  Problem Relation Age of Onset  . Breast cancer Maternal Aunt 50    Social History:  Social History   Socioeconomic History  . Marital status: Married    Spouse name: Not on file  . Number of children: Not on file  . Years of education: Not on file  . Highest education level: Not on file  Occupational History  . Not on file  Tobacco Use  . Smoking status: Never Smoker  . Smokeless tobacco: Never Used  Vaping Use  . Vaping Use: Never used  Substance and Sexual Activity  . Alcohol use: Yes    Comment: Occ  . Drug use: No  . Sexual activity: Yes    Partners: Male    Birth control/protection: None  Other Topics Concern  . Not on file  Social History Narrative  . Not on file   Social Determinants of Health   Financial Resource Strain: Not on file  Food Insecurity: Not on file  Transportation Needs: Not on file  Physical  Activity: Not on file  Stress: Not on file  Social Connections: Not on file  Intimate Partner Violence: Not on file    Allergies:  No Known Allergies  Medications: Prior to Admission medications   Medication Sig Start Date End Date Taking? Authorizing Provider  fluocinonide (LIDEX) 0.05 % external solution Apply 1 application topically 2 (two) times daily as needed (scalp). 04/29/17  Yes [provider]  ketoconazole (NIZORAL) 2 % shampoo Apply 1 application topically daily as needed for irritation. 04/13/18  Yes [provider]  Multiple Vitamins-Minerals (MULTIVITAMIN WITH MINERALS) tablet Take 1 tablet by mouth daily.   Yes [provider]  omeprazole (PRILOSEC) 20 MG capsule Take 20 mg by mouth daily as needed (acid reflux). 09/09/16  Yes [provider]  traZODone (DESYREL) 50 MG tablet Take 50 mg by mouth at bedtime. 02/07/20  Yes [provider]    Physical Exam Vitals: Blood pressure 118/62, height 5\' 4"  (1.626 m), weight 170 lb (77.1 kg), last menstrual period 08/25/2020.  General: NAD HEENT: normocephalic, anicteric Pulmonary: No increased work of breathing, CTAB Cardiovascular: RRR, distal pulses 2+ Abdomen: soft, non-tender, non-distended Genitourinary: deferred Extremities: no edema, erythema, or tenderness Neurologic: Grossly intact Psychiatric: mood appropriate, affect full  Imaging No results found.  Assessment: 40 y.o. [id number] presenting for scheduled TLH, BS, cystoscopy  Plan: 1) Patient opts for definitive surgical management via hysterectomy. The risks  of surgery were discussed in detail with the patient including but not limited to: bleeding which may require transfusion or reoperation; infection which may require antibiotics; injury to bowel, bladder, ureters or other surrounding organs (With a literature reported rate of urinary tract injury of 1% quoted); need for additional procedures including laparotomy;  thromboembolic phenomenon, incisional problems and other postoperative/anesthesia complications.  Patient was also advised that recovery procedure generally involves an overnight stay; and the  expected recovery time after a hysterectomy being in the range of 6-8 weeks.  Likelihood of success in alleviating the patient's symptoms was discussed.  While definitive in regards to issues with menstural bleeding, pelvic pain if present preoperatively may continue and in fact worsen postoperatively.  She is aware that the procedure will render her unable to pursue childbearing in the future.   She was told that she will be contacted by our surgical scheduler regarding the time and date of her surgery; routine preoperative instructions of having nothing to eat or drink after midnight on the day prior to surgery and also coming to the hospital 1.5 hours prior to her time of surgery were also emphasized.  She was told she may be called for a preoperative appointment about a week prior to surgery and will be given further preoperative instructions at that visit.  Routine postoperative instructions will be reviewed with the patient and her family in detail after surgery. Printed patient education handouts about the procedure was given to the patient to review at home. - has had prior abdomina plasty for rectus diastasis consider [place] entry does not recall mesh being used - status post prior salpingectomy as well per report  2) Routine postoperative instructions were reviewed with the patient and her family in detail today including the expected length of recovery and likely postoperative course.  The patient concurred with the proposed plan, giving informed written consent for the surgery today.  Patient instructed on the importance of being NPO after midnight prior to her procedure.  If warranted preoperative prophylactic antibiotics and SCDs ordered on call to the OR to meet SCIP guidelines and adhere to  recommendation laid forth in [place] Number 104 May 2009  "Antibiotic Prophylaxis for Gynecologic Procedures".    3) Return in about 17 days (around 09/20/2020) for follow postop.    Malachy Mood, MD, [facility], [facility] 09/03/2020, 2:42 PM

## 2020-09-06 ENCOUNTER — Other Ambulatory Visit: Payer: Self-pay

## 2020-09-06 ENCOUNTER — Encounter
Admission: RE | Admit: 2020-09-06 | Discharge: 2020-09-06 | Disposition: A | Discharge status: Home / Self Care | Payer: Managed Care, Other (non HMO) | Source: Ambulatory Visit | Attending: Obstetrics and Gynecology | Admitting: Obstetrics and Gynecology

## 2020-09-06 HISTORY — DX: Gastro-esophageal reflux disease without esophagitis: K21.9

## 2020-09-06 HISTORY — DX: Insomnia, unspecified: G47.00

## 2020-09-06 NOTE — Patient Instructions (Addendum)
Your procedure is scheduled on:  Thursday, March 3 Report to the Registration Desk on the 1st floor of the [facility]. To find out your arrival time, please call [phone] between 1PM - 3PM on: Wednesday, March 2  REMEMBER: Instructions that are not followed completely may result in serious medical risk, up to and including death; or upon the discretion of your surgeon and anesthesiologist your surgery may need to be rescheduled.  Do not eat food after midnight the night before surgery.  No gum chewing, lozengers or hard candies.  You may however, drink CLEAR liquids up to 2 hours before you are scheduled to arrive for your surgery. Do not drink anything within 2 hours of your scheduled arrival time.  Clear liquids include: - water  - apple juice without pulp - gatorade (not RED, PURPLE, OR BLUE) - black coffee or tea (Do NOT add milk or creamers to the coffee or tea) Do NOT drink anything that is not on this list.  TAKE THESE MEDICATIONS THE MORNING OF SURGERY WITH A SIP OF WATER:  1.  Omeprazole (Prilosec) - (take one the night before and one on the morning of surgery - helps to prevent nausea after surgery.)  One week prior to surgery: starting February 24 Stop Anti-inflammatories (NSAIDS) such as Advil, Aleve, Ibuprofen, Motrin, Naproxen, Naprosyn and Aspirin based products such as Excedrin, Goodys Powder, BC Powder. Stop ANY OVER THE COUNTER supplements until after surgery.  No Alcohol for 24 hours before or after surgery.  No Smoking including e-cigarettes for 24 hours prior to surgery.  No chewable tobacco products for at least 6 hours prior to surgery.  No nicotine patches on the day of surgery.  Do not use any "recreational" drugs for at least a week prior to your surgery.  Please be advised that the combination of cocaine and anesthesia may have negative outcomes, up to and including death. If you test positive for cocaine, your surgery will be cancelled.  On the  morning of surgery brush your teeth with toothpaste and water, you may rinse your mouth with mouthwash if you wish. Do not swallow any toothpaste or mouthwash.  Do not wear jewelry, make-up, hairpins, clips or nail polish.  Do not wear lotions, powders, or perfumes.   Do not shave body from the neck down 48 hours prior to surgery just in case you cut yourself which could leave a site for infection.  Also, freshly shaved skin may become irritated if using the CHG soap.  Contact lenses, hearing aids and dentures may not be worn into surgery.  Do not bring valuables to the hospital. [facility] is not responsible for any missing/lost belongings or valuables.   Use CHG Soap as directed on instruction sheet.   Notify your doctor if there is any change in your medical condition (cold, fever, infection).  Wear comfortable clothing (specific to your surgery type) to the hospital.  Plan for stool softeners for home use; pain medications have a tendency to cause constipation. You can also help prevent constipation by eating foods high in fiber such as fruits and vegetables and drinking plenty of fluids as your diet allows.  After surgery, you can help prevent lung complications by doing breathing exercises.  Take deep breaths and cough every 1-2 hours. Your doctor may order a device called an Incentive Spirometer to help you take deep breaths. When coughing or sneezing, hold a pillow firmly against your incision with both hands. This is called "splinting." Doing  this helps protect your incision. It also decreases belly discomfort.  If you are being admitted to the hospital overnight, leave your suitcase in the car. After surgery it may be brought to your room.  If you are being discharged the day of surgery, you will not be allowed to drive home. You will need a responsible adult (18 years or older) to drive you home and stay with you that night.   If you are taking public transportation, you  will need to have a responsible adult (18 years or older) with you. Please confirm with your physician that it is acceptable to use public transportation.   Please call the [place] [facility] at [phone] if you have any questions about these instructions.  Visitation Policy:  Patients undergoing a surgery or procedure may have one family member or support person with them as long as that person is not COVID-19 positive or experiencing its symptoms.  That person may remain in the waiting area during the procedure.  Inpatient Visitation:    Visiting hours are 7 a.m. to 8 p.m. Patients will be allowed one visitor. The visitor may change daily. The visitor must pass COVID-19 screenings, use hand sanitizer when entering and exiting the patient's room and wear a mask at all times, including in the patient's room. Patients must also wear a mask when staff or their visitor are in the room. Masking is required regardless of vaccination status. Systemwide, no visitors 17 or younger.  Visitation Policy Changes: The following changes are to take effect on Feb. 28 at 7 a.m.  No visitors under the age of 74. Any visitor under the age of 41 must be accompanied by an adult. Adult inpatients: Two visitors will be allowed daily and the visitors may change each day during the patient's stay.

## 2020-09-07 ENCOUNTER — Encounter
Admission: RE | Admit: 2020-09-07 | Discharge: 2020-09-07 | Disposition: A | Discharge status: Home / Self Care | Payer: BC Managed Care – PPO | Source: Ambulatory Visit | Attending: Obstetrics and Gynecology | Admitting: Obstetrics and Gynecology

## 2020-09-07 DIAGNOSIS — Z01812 Encounter for preprocedural laboratory examination: Secondary | ICD-10-CM | POA: Insufficient documentation

## 2020-09-07 LAB — BASIC METABOLIC PANEL
Anion gap: 5 (ref 5–15)
BUN: 9 mg/dL (ref 6–20)
CO2: 26 mmol/L (ref 22–32)
Calcium: 9.4 mg/dL (ref 8.9–10.3)
Chloride: 106 mmol/L (ref 98–111)
Creatinine, Ser: 0.66 mg/dL (ref 0.44–1.00)
GFR, Estimated: >60 mL/min (ref >60)
Glucose, Bld: 95 mg/dL (ref 70–99)
Potassium: 3.8 mmol/L (ref 3.5–5.1)
Sodium: 137 mmol/L (ref 135–145)

## 2020-09-07 LAB — CBC
HCT: 37.2 % (ref 36.0–46.0)
Hemoglobin: 12.5 g/dL (ref 12.0–15.0)
MCH: 29.6 pg (ref 26.0–34.0)
MCHC: 33.6 g/dL (ref 30.0–36.0)
MCV: 88.2 fL (ref 80.0–100.0)
Platelets: 277 10*3/uL (ref 150–400)
RBC: 4.22 MIL/uL (ref 3.87–5.11)
RDW: 11.6 % (ref 11.5–15.5)
WBC: 6.3 10*3/uL (ref 4.0–10.5)
nRBC: 0 % (ref 0.0–0.2)

## 2020-09-07 LAB — TYPE AND SCREEN
ABO/RH(D): A POS
Antibody Screen: NEGATIVE

## 2020-09-11 ENCOUNTER — Other Ambulatory Visit: Payer: Self-pay

## 2020-09-11 ENCOUNTER — Other Ambulatory Visit
Admission: RE | Admit: 2020-09-11 | Discharge: 2020-09-11 | Disposition: A | Discharge status: Home / Self Care | Payer: BC Managed Care – PPO | Source: Ambulatory Visit | Attending: Obstetrics and Gynecology | Admitting: Obstetrics and Gynecology

## 2020-09-11 DIAGNOSIS — Z20822 Contact with and (suspected) exposure to covid-19: Secondary | ICD-10-CM | POA: Diagnosis not present

## 2020-09-11 DIAGNOSIS — Z01812 Encounter for preprocedural laboratory examination: Secondary | ICD-10-CM | POA: Diagnosis not present

## 2020-09-11 LAB — SARS CORONAVIRUS 2 (TAT 6-24 HRS): SARS Coronavirus 2: NEGATIVE

## 2020-09-13 ENCOUNTER — Encounter
Admission: RE | Disposition: A | Discharge status: Home / Self Care | Payer: Self-pay | Source: Home / Self Care | Attending: Obstetrics and Gynecology

## 2020-09-13 ENCOUNTER — Ambulatory Visit: Payer: BC Managed Care – PPO | Admitting: Certified Registered Nurse Anesthetist

## 2020-09-13 ENCOUNTER — Other Ambulatory Visit: Payer: Self-pay | Admitting: Obstetrics and Gynecology

## 2020-09-13 ENCOUNTER — Other Ambulatory Visit: Payer: Self-pay

## 2020-09-13 ENCOUNTER — Observation Stay
Admission: RE | Admit: 2020-09-13 | Discharge: 2020-09-13 | Disposition: A | Discharge status: Home / Self Care | Payer: BC Managed Care – PPO | Attending: Obstetrics and Gynecology | Admitting: Obstetrics and Gynecology

## 2020-09-13 ENCOUNTER — Encounter: Payer: Self-pay | Admitting: Obstetrics and Gynecology

## 2020-09-13 DIAGNOSIS — Z9889 Other specified postprocedural states: Secondary | ICD-10-CM

## 2020-09-13 DIAGNOSIS — N840 Polyp of corpus uteri: Secondary | ICD-10-CM | POA: Diagnosis not present

## 2020-09-13 DIAGNOSIS — R112 Nausea with vomiting, unspecified: Secondary | ICD-10-CM

## 2020-09-13 DIAGNOSIS — D251 Intramural leiomyoma of uterus: Secondary | ICD-10-CM | POA: Diagnosis not present

## 2020-09-13 DIAGNOSIS — N800 Endometriosis of uterus: Secondary | ICD-10-CM

## 2020-09-13 DIAGNOSIS — D252 Subserosal leiomyoma of uterus: Secondary | ICD-10-CM

## 2020-09-13 DIAGNOSIS — N939 Abnormal uterine and vaginal bleeding, unspecified: Secondary | ICD-10-CM

## 2020-09-13 DIAGNOSIS — N72 Inflammatory disease of cervix uteri: Secondary | ICD-10-CM | POA: Diagnosis not present

## 2020-09-13 DIAGNOSIS — Z90710 Acquired absence of both cervix and uterus: Secondary | ICD-10-CM

## 2020-09-13 HISTORY — PX: LAPAROSCOPIC HYSTERECTOMY: SHX1926

## 2020-09-13 HISTORY — PX: CYSTOSCOPY: SHX5120

## 2020-09-13 LAB — ABO/RH: ABO/RH(D): A POS

## 2020-09-13 LAB — POCT PREGNANCY, URINE: Preg Test, Ur: NEGATIVE

## 2020-09-13 SURGERY — HYSTERECTOMY, TOTAL, LAPAROSCOPIC
Anesthesia: General | Laterality: Bilateral

## 2020-09-13 MED ORDER — LIDOCAINE HCL (CARDIAC) PF 100 MG/5ML IV SOSY
PREFILLED_SYRINGE | INTRAVENOUS | Status: DC | PRN
  Administered 2020-09-13: 80 mg via INTRAVENOUS

## 2020-09-13 MED ORDER — MIDAZOLAM HCL 2 MG/2ML IJ SOLN
INTRAMUSCULAR | Status: DC | PRN
  Administered 2020-09-13: 2 mg via INTRAVENOUS

## 2020-09-13 MED ORDER — OXYCODONE-ACETAMINOPHEN 5-325 MG PO TABS
1.0000 | ORAL_TABLET | ORAL | Status: DC | PRN
  Administered 2020-09-13: 1 via ORAL
  Filled 2020-09-13: qty 1

## 2020-09-13 MED ORDER — FENTANYL CITRATE (PF) 100 MCG/2ML IJ SOLN
INTRAMUSCULAR | Status: AC
  Filled 2020-09-13: qty 2

## 2020-09-13 MED ORDER — FENTANYL CITRATE (PF) 100 MCG/2ML IJ SOLN
INTRAMUSCULAR | Status: DC | PRN
  Administered 2020-09-13 (×4): 50 ug via INTRAVENOUS

## 2020-09-13 MED ORDER — PROPOFOL 500 MG/50ML IV EMUL
INTRAVENOUS | Status: DC | PRN
  Administered 2020-09-13: 100 ug/kg/min via INTRAVENOUS

## 2020-09-13 MED ORDER — OXYCODONE HCL 5 MG/5ML PO SOLN
5.0000 mg | Freq: Once | ORAL | Status: AC | PRN
Start: 2020-09-13 — End: 2020-09-13

## 2020-09-13 MED ORDER — LACTATED RINGERS IV SOLN: INTRAVENOUS | Status: DC

## 2020-09-13 MED ORDER — BUPIVACAINE HCL 0.5 % IJ SOLN
INTRAMUSCULAR | Status: DC | PRN
  Administered 2020-09-13: 14 mL

## 2020-09-13 MED ORDER — IBUPROFEN 600 MG PO TABS: 600.0000 mg | ORAL_TABLET | Freq: Four times a day (QID) | ORAL | 0 refills | Status: AC | PRN

## 2020-09-13 MED ORDER — ONDANSETRON HCL 4 MG PO TABS: 4.0000 mg | ORAL_TABLET | Freq: Four times a day (QID) | ORAL | Status: DC | PRN

## 2020-09-13 MED ORDER — DEXAMETHASONE SODIUM PHOSPHATE 10 MG/ML IJ SOLN
INTRAMUSCULAR | Status: DC | PRN
  Administered 2020-09-13: 10 mg via INTRAVENOUS

## 2020-09-13 MED ORDER — OXYCODONE-ACETAMINOPHEN 5-325 MG PO TABS: 1.0000 | ORAL_TABLET | ORAL | 0 refills | Status: DC | PRN

## 2020-09-13 MED ORDER — OXYCODONE HCL 5 MG PO TABS
ORAL_TABLET | ORAL | Status: AC
  Filled 2020-09-13: qty 1

## 2020-09-13 MED ORDER — ROCURONIUM BROMIDE 100 MG/10ML IV SOLN
INTRAVENOUS | Status: DC | PRN
  Administered 2020-09-13: 30 mg via INTRAVENOUS
  Administered 2020-09-13: 50 mg via INTRAVENOUS
  Administered 2020-09-13: 20 mg via INTRAVENOUS

## 2020-09-13 MED ORDER — ORAL CARE MOUTH RINSE: 15.0000 mL | Freq: Once | OROMUCOSAL | Status: AC

## 2020-09-13 MED ORDER — HEMOSTATIC AGENTS (NO CHARGE) OPTIME
TOPICAL | Status: DC | PRN
  Administered 2020-09-13: 1 via TOPICAL

## 2020-09-13 MED ORDER — SUGAMMADEX SODIUM 200 MG/2ML IV SOLN
INTRAVENOUS | Status: DC | PRN
  Administered 2020-09-13: 154.2 mg via INTRAVENOUS
  Administered 2020-09-13: 100 mg via INTRAVENOUS

## 2020-09-13 MED ORDER — MENTHOL 3 MG MT LOZG
1.0000 | LOZENGE | OROMUCOSAL | Status: DC | PRN
  Filled 2020-09-13: qty 9

## 2020-09-13 MED ORDER — LIDOCAINE HCL (PF) 2 % IJ SOLN
INTRAMUSCULAR | Status: AC
  Filled 2020-09-13: qty 5

## 2020-09-13 MED ORDER — MIDAZOLAM HCL 2 MG/2ML IJ SOLN
INTRAMUSCULAR | Status: AC
  Filled 2020-09-13: qty 2

## 2020-09-13 MED ORDER — ACETAMINOPHEN 10 MG/ML IV SOLN
INTRAVENOUS | Status: DC | PRN
  Administered 2020-09-13: 1000 mg via INTRAVENOUS

## 2020-09-13 MED ORDER — CHLORHEXIDINE GLUCONATE 0.12 % MT SOLN
15.0000 mL | Freq: Once | OROMUCOSAL | Status: AC
  Administered 2020-09-13: 15 mL via OROMUCOSAL

## 2020-09-13 MED ORDER — KETOROLAC TROMETHAMINE 30 MG/ML IJ SOLN
30.0000 mg | Freq: Four times a day (QID) | INTRAMUSCULAR | Status: DC | PRN
  Administered 2020-09-13: 30 mg via INTRAVENOUS
  Filled 2020-09-13: qty 1

## 2020-09-13 MED ORDER — DEXTROSE-NACL 5-0.45 % IV SOLN: INTRAVENOUS | Status: DC

## 2020-09-13 MED ORDER — SUCCINYLCHOLINE CHLORIDE 200 MG/10ML IV SOSY
PREFILLED_SYRINGE | INTRAVENOUS | Status: AC
  Filled 2020-09-13: qty 10

## 2020-09-13 MED ORDER — DEXAMETHASONE SODIUM PHOSPHATE 10 MG/ML IJ SOLN
INTRAMUSCULAR | Status: AC
  Filled 2020-09-13: qty 1

## 2020-09-13 MED ORDER — ACETAMINOPHEN 10 MG/ML IV SOLN
INTRAVENOUS | Status: AC
  Filled 2020-09-13: qty 100

## 2020-09-13 MED ORDER — POVIDONE-IODINE 10 % EX SWAB
2.0000 "application " | Freq: Once | CUTANEOUS | Status: AC
  Administered 2020-09-13: 2 via TOPICAL

## 2020-09-13 MED ORDER — PROPOFOL 10 MG/ML IV BOLUS
INTRAVENOUS | Status: AC
  Filled 2020-09-13: qty 20

## 2020-09-13 MED ORDER — FENTANYL CITRATE (PF) 100 MCG/2ML IJ SOLN
25.0000 ug | INTRAMUSCULAR | Status: DC | PRN
  Administered 2020-09-13: 25 ug via INTRAVENOUS

## 2020-09-13 MED ORDER — CHLORHEXIDINE GLUCONATE 0.12 % MT SOLN
OROMUCOSAL | Status: AC
  Filled 2020-09-13: qty 15

## 2020-09-13 MED ORDER — PROPOFOL 10 MG/ML IV BOLUS
INTRAVENOUS | Status: DC | PRN
  Administered 2020-09-13: 150 mg via INTRAVENOUS

## 2020-09-13 MED ORDER — PROPOFOL 500 MG/50ML IV EMUL
INTRAVENOUS | Status: AC
  Filled 2020-09-13: qty 50

## 2020-09-13 MED ORDER — EPHEDRINE 5 MG/ML INJ
INTRAVENOUS | Status: AC
  Filled 2020-09-13: qty 10

## 2020-09-13 MED ORDER — PROMETHAZINE HCL 25 MG/ML IJ SOLN: 6.2500 mg | INTRAMUSCULAR | Status: DC | PRN

## 2020-09-13 MED ORDER — KETOROLAC TROMETHAMINE 30 MG/ML IJ SOLN
INTRAMUSCULAR | Status: AC
  Filled 2020-09-13: qty 1

## 2020-09-13 MED ORDER — ONDANSETRON HCL 4 MG/2ML IJ SOLN
INTRAMUSCULAR | Status: DC | PRN
  Administered 2020-09-13: 4 mg via INTRAVENOUS

## 2020-09-13 MED ORDER — ONDANSETRON HCL 4 MG/2ML IJ SOLN
INTRAMUSCULAR | Status: AC
  Filled 2020-09-13: qty 2

## 2020-09-13 MED ORDER — SIMETHICONE 80 MG PO CHEW: 80.0000 mg | CHEWABLE_TABLET | Freq: Four times a day (QID) | ORAL | Status: DC | PRN

## 2020-09-13 MED ORDER — ROCURONIUM BROMIDE 10 MG/ML (PF) SYRINGE
PREFILLED_SYRINGE | INTRAVENOUS | Status: AC
  Filled 2020-09-13: qty 10

## 2020-09-13 MED ORDER — LACTATED RINGERS IV SOLN: INTRAVENOUS | Status: DC | PRN

## 2020-09-13 MED ORDER — CEFAZOLIN SODIUM-DEXTROSE 2-4 GM/100ML-% IV SOLN
INTRAVENOUS | Status: AC
  Filled 2020-09-13: qty 100

## 2020-09-13 MED ORDER — ONDANSETRON 4 MG PO TBDP
4.0000 mg | ORAL_TABLET | Freq: Four times a day (QID) | ORAL | 0 refills | Status: AC | PRN
Start: 2020-09-13 — End: ?

## 2020-09-13 MED ORDER — SCOPOLAMINE 1 MG/3DAYS TD PT72
MEDICATED_PATCH | TRANSDERMAL | Status: AC
  Filled 2020-09-13: qty 1

## 2020-09-13 MED ORDER — ONDANSETRON HCL 4 MG/2ML IJ SOLN: 4.0000 mg | Freq: Four times a day (QID) | INTRAMUSCULAR | Status: DC | PRN

## 2020-09-13 MED ORDER — KETOROLAC TROMETHAMINE 30 MG/ML IJ SOLN
INTRAMUSCULAR | Status: DC | PRN
  Administered 2020-09-13: 30 mg via INTRAVENOUS

## 2020-09-13 MED ORDER — OXYCODONE HCL 5 MG PO TABS
5.0000 mg | ORAL_TABLET | Freq: Once | ORAL | Status: AC | PRN
Start: 2020-09-13 — End: 2020-09-13
  Administered 2020-09-13: 5 mg via ORAL

## 2020-09-13 MED ORDER — CEFAZOLIN SODIUM-DEXTROSE 2-4 GM/100ML-% IV SOLN
2.0000 g | INTRAVENOUS | Status: AC
  Administered 2020-09-13: 2 g via INTRAVENOUS

## 2020-09-13 SURGICAL SUPPLY — 56 items
ADH SKN CLS APL DERMABOND .7 (GAUZE/BANDAGES/DRESSINGS) ×2
APL PRP STRL LF DISP 70% ISPRP (MISCELLANEOUS) ×2
APL SRG 38 LTWT LNG FL B (MISCELLANEOUS) ×2
APPLICATOR ARISTA FLEXITIP XL (MISCELLANEOUS) ×1 IMPLANT
BAG DRN RND TRDRP ANRFLXCHMBR (UROLOGICAL SUPPLIES) ×2
BAG URINE DRAIN 2000ML AR STRL (UROLOGICAL SUPPLIES) ×3 IMPLANT
BLADE SURG SZ11 CARB STEEL (BLADE) ×3 IMPLANT
CATH FOLEY 2WAY  5CC 16FR (CATHETERS) ×3
CATH FOLEY 2WAY 5CC 16FR (CATHETERS) ×2
CATH URTH 16FR FL 2W BLN LF (CATHETERS) ×2 IMPLANT
CHLORAPREP W/TINT 26 (MISCELLANEOUS) ×3 IMPLANT
DEFOGGER SCOPE WARMER CLEARIFY (MISCELLANEOUS) ×3 IMPLANT
DERMABOND ADVANCED (GAUZE/BANDAGES/DRESSINGS) ×1
DERMABOND ADVANCED .7 DNX12 (GAUZE/BANDAGES/DRESSINGS) ×2 IMPLANT
DEVICE SUTURE ENDOST 10MM (ENDOMECHANICALS) ×3 IMPLANT
ELECT EZSTD 165MM 6.5IN (MISCELLANEOUS) ×3
ELECTRODE EZSTD 165MM 6.5IN (MISCELLANEOUS) IMPLANT
GAUZE 4X4 16PLY RFD (DISPOSABLE) ×3 IMPLANT
GLOVE SURG ENC MOIS LTX SZ7 (GLOVE) ×17 IMPLANT
GLOVE SURG UNDER LTX SZ7.5 (GLOVE) ×3 IMPLANT
GOWN STRL REUS W/ TWL LRG LVL3 (GOWN DISPOSABLE) ×4 IMPLANT
GOWN STRL REUS W/ TWL XL LVL3 (GOWN DISPOSABLE) ×2 IMPLANT
GOWN STRL REUS W/TWL LRG LVL3 (GOWN DISPOSABLE) ×12
GOWN STRL REUS W/TWL XL LVL3 (GOWN DISPOSABLE) ×3
GRASPER SUT TROCAR 14GX15 (MISCELLANEOUS) ×3 IMPLANT
HEMOSTAT ARISTA ABSORB 3G PWDR (HEMOSTASIS) ×1 IMPLANT
IRRIGATION STRYKERFLOW (MISCELLANEOUS) ×2 IMPLANT
IRRIGATOR STRYKERFLOW (MISCELLANEOUS) ×3
IV LACTATED RINGERS 1000ML (IV SOLUTION) ×3 IMPLANT
IV NS 1000ML (IV SOLUTION) ×3
IV NS 1000ML BAXH (IV SOLUTION) ×2 IMPLANT
KIT PINK PAD W/HEAD ARE REST (MISCELLANEOUS) ×3
KIT PINK PAD W/HEAD ARM REST (MISCELLANEOUS) ×2 IMPLANT
LABEL OR SOLS (LABEL) ×3 IMPLANT
MANIFOLD NEPTUNE II (INSTRUMENTS) ×3 IMPLANT
MANIPULATOR VCARE LG CRV RETR (MISCELLANEOUS) ×1 IMPLANT
NS IRRIG 500ML POUR BTL (IV SOLUTION) ×3 IMPLANT
OCCLUDER COLPOPNEUMO (BALLOONS) ×1 IMPLANT
PACK GYN LAPAROSCOPIC (MISCELLANEOUS) ×3 IMPLANT
PAD OB MATERNITY 4.3X12.25 (PERSONAL CARE ITEMS) ×3 IMPLANT
PAD PREP 24X41 OB/GYN DISP (PERSONAL CARE ITEMS) ×3 IMPLANT
SCISSORS METZENBAUM CVD 33 (INSTRUMENTS) ×3 IMPLANT
SET CYSTO W/LG BORE CLAMP LF (SET/KITS/TRAYS/PACK) ×3 IMPLANT
SHEARS HARMONIC ACE PLUS 36CM (ENDOMECHANICALS) ×3 IMPLANT
SLEEVE ENDOPATH XCEL 5M (ENDOMECHANICALS) ×3 IMPLANT
SPONGE LAP 18X18 RF (DISPOSABLE) ×2 IMPLANT
SUT ENDO VLOC 180-0-8IN (SUTURE) ×3 IMPLANT
SUT MNCRL 4-0 (SUTURE) ×6
SUT MNCRL 4-0 27XMFL (SUTURE) ×4
SUT VIC AB 0 CT1 36 (SUTURE) ×3 IMPLANT
SUTURE MNCRL 4-0 27XMF (SUTURE) ×4 IMPLANT
SYR 10ML LL (SYRINGE) ×3 IMPLANT
SYR 50ML LL SCALE MARK (SYRINGE) ×3 IMPLANT
TROCAR ENDO BLADELESS 11MM (ENDOMECHANICALS) ×3 IMPLANT
TROCAR XCEL NON-BLD 5MMX100MML (ENDOMECHANICALS) ×3 IMPLANT
TUBING EVAC SMOKE HEATED PNEUM (TUBING) ×3 IMPLANT

## 2020-09-13 NOTE — Anesthesia Procedure Notes (Signed)
Procedure Name: Intubation Performed by: Demetrius Charity, CRNA Pre-anesthesia Checklist: Patient identified, Patient being monitored, Timeout performed, Emergency Drugs available and Suction available Patient Re-evaluated:Patient Re-evaluated prior to induction Oxygen Delivery Method: Circle system utilized Preoxygenation: Pre-oxygenation with 100% oxygen Induction Type: IV induction Ventilation: Mask ventilation without difficulty Laryngoscope Size: Mac and 3 Grade View: Grade II Tube type: Oral Tube size: 6.5 mm Number of attempts: 1 Airway Equipment and Method: Stylet Placement Confirmation: ETT inserted through vocal cords under direct vision,  positive ETCO2 and breath sounds checked- equal and bilateral Secured at: 22 cm Tube secured with: Tape Dental Injury: Teeth and Oropharynx as per pre-operative assessment

## 2020-09-13 NOTE — Anesthesia Postprocedure Evaluation (Signed)
Anesthesia Post Note  Patient: Valerie Valdez  Procedure(s) Performed: TOTAL LAPAROSCOPIC HYSTERECTOMY (Bilateral ) CYSTOSCOPY  Patient location during evaluation: PACU Anesthesia Type: General Level of consciousness: awake and alert and oriented Pain management: pain level controlled Vital Signs Assessment: post-procedure vital signs reviewed and stable Respiratory status: spontaneous breathing Cardiovascular status: blood pressure returned to baseline Anesthetic complications: no   No complications documented.   Last Vitals:  Vitals:   09/13/20 1225 09/13/20 1556  BP: 99/65 102/62  Pulse: 62 60  Resp: 14 20  Temp: 37 C 37.1 C  SpO2: 99%     Last Pain:  Vitals:   09/13/20 1344  PainSc: 5                  Kinnley Paulson

## 2020-09-13 NOTE — Transfer of Care (Signed)
Immediate Anesthesia Transfer of Care Note  Patient: Valerie Valdez  Procedure(s) Performed: TOTAL LAPAROSCOPIC HYSTERECTOMY (Bilateral ) CYSTOSCOPY  Patient Location: PACU  Anesthesia Type:General  Level of Consciousness: awake and alert   Airway & Oxygen Therapy: Patient Spontanous Breathing and Patient connected to face mask oxygen  Post-op Assessment: Report given to RN and Post -op Vital signs reviewed and stable  Post vital signs: Reviewed and stable  Last Vitals:  Vitals Value Taken Time  BP 123/87 09/13/20 0955  Temp    Pulse 72 09/13/20 0955  Resp 14 09/13/20 0956  SpO2 100 % 09/13/20 0955  Vitals shown include unvalidated device data.  Last Pain:  Vitals:   09/13/20 0623  PainSc: 0-No pain         Complications: No complications documented.

## 2020-09-13 NOTE — Progress Notes (Signed)
Discharge order received from doctor. Reviewed discharge instructions and prescriptions with patient and answered all questions. Follow up appointment  given. Patient verbalized understanding. Patient discharged home via wheelchair by nursing/auxillary.    Audree Bane, RN

## 2020-09-13 NOTE — Op Note (Signed)
Preoperative Diagnosis: 1) 40 y.o. with abnormal uterine bleeding and dysmenorrhea 2) Uterine fibroids  Postoperative Diagnosis: 1) 40 y.o. with abnormal uterine bleeding and dysmenorrhea 2) Uterine fibroids 3) Stage I endometriosis   Operation Performed: Total laparoscopic hysterectomy, bilateral salpingectomy, and cystoscopy  Indication: abnormal uterine bleeding, fibroids  Surgeon: Malachy Mood, MD  Assistant: Prentice Docker, MD  This surgery required a high level surgical assistant with none other readily available  Anesthesia: General  Preoperative Antibiotics: none  Estimated Blood Loss: 50 mL  IV Fluids: 930mL  Urine Output:: 554mL  Drains or Tubes: none  Implants: none  Specimens Removed: Uterus and cervix  Complications: none  Intraoperative Findings: Normal cervix and ovaries.  Absent fallopian tubes bilaterally.  Uterus with irregular contour and several small fibroids.  Endometriosis implants noted posterior cervix and left uterosacral ligament.    Patient Condition: stable  Procedure in Detail:  Patient was taken to the operating room where she was administered general anesthesia.  She was positioned in the dorsal lithotomy position utilizing Allen stirups, prepped and draped in the usual sterile fashion.  Prior to proceeding with procedure a time out was performed.  Attention was turned to the patient's pelvis.  An indwelling foley catheter was placed to decompress the patient's bladder.  An operative speculum was placed to allow visualization of the cervix.  The anterior lip of the cervix was grasped with a single tooth tenaculum, and a large V-care uterine manipulator was placed to allow manipulation of the uterus.  The operative speculum and single tooth tenaculum were then removed.  Attention was turned to the patient's abdomen.  The left upper quadrant was infiltrated with 1% Sensorcaine, before making a stab incision using an 11 blade scalpel.  A 16mm  Excel trocar was then used to gain direct entry into the peritoneal cavity utilizing the camera to visualize progress of the trocar during placement.  Once peritoneal entry had been achieved, insufflation was started and pneumoperitoneum established at a pressure of 5mmHg.    A supraumbilical and a right lower quadrant site were then injected with 1% Sensorcaine and a stab incision was made using an 11 blade scalpel.  One additional 46mm Excel trocars was placed through the right lower quadrant site and one 73mm Excel trocar was placed through to supraumbilical site were under direct visualization. General inspection of the abdomen revealed the above noted findings.   The left utero ovarian ligament was identified ligated and transected using the Harmonic scalpel. The round ligament was then likewise ligated and transected.  The anterior leaf of the broad ligament was dissected down to the level of the internal cervical os and a bladder flap was started.  The posterior leaf of the broad ligament was dissected down to the utero-sacral ligament.  The uterine artery was skeletonized before being ligated and transected using the Harmonic scalpel with cephelad pressure applied to the V-care device to assure lateralization of the ureter.  A bite was then taken with Harmonic medial to transected portio of uterine artery to further lateralize the ureter and vessel off the V-care cup.  The patient right adnexal structures were then dissected in similar fashion by Dr. Glennon Mac.  The bladder flap was completed and the bladder mobilized off the V-care cup.  An posterior colpotomy was scores and carried around in a clockwise fashion to free the specimen, which was then removed vaginally.  Inspection revealed all pedicles to be hemostatic before proceed with vaginal closure.  The colpotomy was closed using  a running 0 Vicryl  Endostitch.  The cuff was hemsotatic at the conclusion of closure without visible or palpable defects.Loletta Parish was applied to all pedicles.   The 13mm Trocar site fascia was closed using a Carter-Thompson and 0 Vicryl.   Pneumoperitoneum was evacuated and trocars were removed.  All trocar sites were then dressed with surgical skin glue.   The indwelling foley catheter was removed.  Cystoscopy was performed noting and intact bladder dome as well as brisk efflux of urine from bother ureteral orifices.  The cystoscopy was removed and the indwelling foley catheter was not replaced.  Sponge needle and instrument counts were correct time two.  The patient tolerated the procedure well and was taken to the recovery room in stable condition.

## 2020-09-13 NOTE — Anesthesia Preprocedure Evaluation (Addendum)
Anesthesia Evaluation  Patient identified by MRN, date of birth, ID band Patient awake    Reviewed: Allergy & Precautions, H&P , NPO status , Patient's Chart, lab work & pertinent test results  History of Anesthesia Complications Negative for: history of anesthetic complications  Airway Mallampati: II  TM Distance: >3 FB     Dental  (+) Teeth Intact   Pulmonary neg pulmonary ROS, neg sleep apnea, neg COPD,    breath sounds clear to auscultation       Cardiovascular (-) angina(-) Past MI and (-) Cardiac Stents negative cardio ROS  (-) dysrhythmias  Rhythm:regular Rate:Normal     Neuro/Psych negative neurological ROS  negative psych ROS   GI/Hepatic Neg liver ROS, GERD  Controlled and Medicated,  Endo/Other  negative endocrine ROS  Renal/GU      Musculoskeletal   Abdominal   Peds  Hematology negative hematology ROS (+)   Anesthesia Other Findings Past Medical History: 2012: Ectopic pregnancy of left ovary No date: GERD (gastroesophageal reflux disease) No date: Insomnia No date: Psoriasis  Past Surgical History: 2015: ABDOMINOPLASTY No date: APPENDECTOMY 2015: BREAST SURGERY; Bilateral     Comment:  breast lift 2007, 2009, 2011: CESAREAN SECTION 2009: COLONOSCOPY 2012: LAPAROSCOPIC BILATERAL SALPINGECTOMY 2015: MASTOPEXY No date: TUBAL LIGATION  BMI    Body Mass Index: 29.18 kg/m      Reproductive/Obstetrics negative OB ROS                          Anesthesia Physical Anesthesia Plan  ASA: II  Anesthesia Plan: General ETT   Post-op Pain Management:    Induction:   PONV Risk Score and Plan: 4 or greater and Ondansetron, Dexamethasone, Midazolam, Treatment may vary due to age or medical condition, Propofol infusion and TIVA  Airway Management Planned:   Additional Equipment:   Intra-op Plan:   Post-operative Plan:   Informed Consent: I have reviewed the patients  History and Physical, chart, labs and discussed the procedure including the risks, benefits and alternatives for the proposed anesthesia with the patient or authorized representative who has indicated his/her understanding and acceptance.     Dental Advisory Given  Plan Discussed with: Anesthesiologist, CRNA and Surgeon  Anesthesia Plan Comments:        Anesthesia Quick Evaluation

## 2020-09-13 NOTE — Interval H&P Note (Signed)
History and Physical Interval Note:  09/13/2020 7:24 AM  Valerie Valdez  has presented today for surgery, with the diagnosis of AUB, uterine fibroids.  The various methods of treatment have been discussed with the patient and family. After consideration of risks, benefits and other options for treatment, the patient has consented to  Procedure(s): TOTAL LAPAROSCOPIC HYSTERECTOMY WITH BILATERAL SALPINGECTOMY (Bilateral) as a surgical intervention.  The patient's history has been reviewed, patient examined, no change in status, stable for surgery.  I have reviewed the patient's chart and labs.  Questions were answered to the patient's satisfaction.     Malachy Mood

## 2020-09-14 ENCOUNTER — Encounter: Payer: Self-pay | Admitting: Obstetrics and Gynecology

## 2020-09-14 LAB — SURGICAL PATHOLOGY

## 2020-09-20 ENCOUNTER — Ambulatory Visit (INDEPENDENT_AMBULATORY_CARE_PROVIDER_SITE_OTHER): Payer: BC Managed Care – PPO | Admitting: Obstetrics and Gynecology

## 2020-09-20 ENCOUNTER — Encounter: Payer: Self-pay | Admitting: Obstetrics and Gynecology

## 2020-09-20 ENCOUNTER — Other Ambulatory Visit: Payer: Self-pay

## 2020-09-20 VITALS — BP 100/72 | Wt 171.0 lb

## 2020-09-20 DIAGNOSIS — Z4889 Encounter for other specified surgical aftercare: Secondary | ICD-10-CM

## 2020-09-20 MED ORDER — OXYCODONE-ACETAMINOPHEN 5-325 MG PO TABS: 1.0000 | ORAL_TABLET | ORAL | 0 refills | Status: DC | PRN

## 2020-09-20 NOTE — Progress Notes (Signed)
Postoperative Follow-up Patient presents post op from [facility], cystoscopy 1weeks ago for abnormal uterine bleeding (fibroids and adenomyosis).  Subjective: Patient reports some improvement in her preop symptoms. Eating a regular diet without difficulty. Pain is not well controlled.  Medications being used: prescription NSAID's including ibuprofen (Motrin) and narcotic analgesics including oxycodone/acetaminophen (Percocet, Tylox).  Activity: normal activities of daily living.  Objective: Blood pressure 100/72, weight 171 lb (77.6 kg), last menstrual period 08/25/2020.   General: NAD Pulmonary: no increased work of breathing Abdomen: soft, non-tender, non-distended, incision(s) D/C/I Extremities: no edema Neurologic: normal gait    Admission on 09/13/2020, Discharged on 09/13/2020  Component Date Value Ref Range Status  . ABO/RH(D) 09/13/2020    Final                   Value:A POS Performed at [facility], [address], [place], [place] [address]   . Preg Test, Ur 09/13/2020 NEGATIVE  NEGATIVE Final   Comment:        THE SENSITIVITY OF THIS METHODOLOGY IS >24 mIU/mL   . SURGICAL PATHOLOGY 09/13/2020    Final-Edited                   Value:SURGICAL PATHOLOGY CASE: [id number] PATIENT: [facility] Report     Specimen Submitted: A. Uterus, Cervix  Clinical History: AUB, uterine fibroids      DIAGNOSIS: A. 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 - SECRETORY ENDOMETRIUM WITH 0.9 CM ENDOMETRIAL POLYP. - MYOMETRIUM WITH ADENOMYOSIS AND 0.6 CM INTRAMURAL LEIOMYOMA. - ANTERIOR LOWER UTERINE SEGMENT WITH 2.2 CM DILATED AREA CONTAINING BLOOD. - POSTERIOR SEROSA WITH FOCAL VASCULAR CONGESTION. - NEGATIVE FOR ATYPIA AND MALIGNANCY.  Comment: The significance of the 2.2 cm dilated area containing blood at the anterior lower uterine segment is unclear. This might be an area  of prior scarring leading to focal distortion of the endometrial cavity. Correlation with clinical impression is required.   GROSS DESCRIPTION: A. Labeled: Uterus and cervix Received: Formalin Collection time: 8:44 AM on 09/13/2020 Placed into formalin                          time: Not provided Weight: 108 grams Dimensions:      Fundus -9.4 (superior to inferior) x 6.2 (breadth of uterus at fundus) x 3.4 (anterior to posterior) cm      Cervix -4.2 x 4.1 cm Serosa: The serosa is tan-pink, smooth, and glistening with areas of prominent vessels on the posterior body. Cervix: The cervix is white-pink, smooth, and pearly. Endocervix: The endocervix is tan, mucoid, and finely granular with a 0.6 cm slightly rounded endocervical os. Endometrial cavity:      Dimensions -4.1 (superior to inferior) x 3.2 (cornu to cornu) cm      Thickness -Ranges from 0.2-0.3 cm      Other findings -the endometrium is tan and finely granular with a 0.9 x 0.7 x 0.3 cm polyp at the left cornu. At the anterior lower uterine segment there is a 2.2 x 1.6 x 1 cm dilated cavity-like area, which contains an abundant amount of blood and blood clot. Myometrium:     Thickness -2.2 cm     Other findings -the myometrium is tan-pink and soft with a single 0.6 x 0.6 x 0.5 cm i  ntramural nodule. The nodule is pink-white, whorled, firm, and well-circumscribed. Other comments: None grossly appreciated.  Block summary: 1 - representative anterior cervix/endocervix 2 - representative posterior cervix/endocervix 3 - representative anterior transmural endomyometrium 4 - representative posterior transmural endomyometrium 5 - representative serosal vessels, endometrial polyp, and intramural nodule 6 - representative cavity-like area in the endometrium  RB 09/13/2020   Final Diagnosis performed by Quay Burow, MD.   Electronically signed 09/14/2020 10:23:09AM The electronic signature indicates that  the named Attending Pathologist has evaluated the specimen Technical component performed at [facility], [address] [place], [place] [address] Lab: [phone] Dir: Rush Farmer, MD, MMM  Professional component performed at [facility], [facility], [place], [place], [place] [address] Lab: [phone] Dir: Dellia Nims. Rubinas, MD     Assessment: 40 y.o. s/p TLH, cystoscopy stable  Plan: Patient has done well after surgery with no apparent complications.  I have discussed the post-operative course to date, and the expected progress moving forward.  The patient understands what complications to be concerned about.  I will see the patient in routine follow up, or sooner if needed.    Activity plan: No heavy lifting, no intercourse  Refill percocet  Return in about 5 weeks (around 10/25/2020) for 6 week postop.    Malachy Mood, MD, [facility], [facility] 09/20/2020, 10:30 AM

## 2020-09-21 NOTE — Telephone Encounter (Signed)
Pt calling; was seen Thurs; pharm has requested more info from doctor; needs pain medication refill from surgery.  [phone]

## 2020-09-26 ENCOUNTER — Other Ambulatory Visit: Payer: Self-pay | Admitting: Obstetrics and Gynecology

## 2020-09-26 MED ORDER — UROGESIC-BLUE 81.6 MG PO TABS: 1.0000 | ORAL_TABLET | Freq: Four times a day (QID) | ORAL | 0 refills | Status: AC

## 2020-09-26 NOTE — Progress Notes (Signed)
Still having some bladder discomfort with voiding, [facility] called in.  If symptoms perists discussed UA/Culture but no frequency or dysuria just discomfort.

## 2020-10-01 NOTE — Telephone Encounter (Signed)
OK if she want to with lifting restriction in place though and half days is fine

## 2020-10-10 NOTE — Discharge Summary (Signed)
Physician Discharge Summary  Patient ID: Valerie [facility] MRN: [id number] DOB/AGE: Jul 04, 1981 40 y.o.  Admit date: 09/13/2020 Discharge date: 09/14/2020  Admission Diagnoses: AUB-L  Discharge Diagnoses:  Active Problems:   Abnormal uterine bleeding (AUB)   Intramural and subserous leiomyoma of uterus   S/P laparoscopic hysterectomy   Discharged Condition: good  [facility] Course: 40 y.o. [id number] admitted for scheduled TLH, BS, cystoscopy for abnormal bleeding secondary to uterine fibroids.  Patient underwent uncomplicated procedure 09/16/91.  She did well postoperatively reporting good pain control on po analgesics.  Throughout hospitalization patient remained afebrile and hemodynamically stable.  POD 1 labs revealed stable CBC, BUN/Cr.  She was ambulating and voiding spontaneously at discharge, tolerating po.  Consults: None  Significant Diagnostic Studies:  Recent Results (from the past 2160 hour(s))  Type and screen     Status: None   Collection Time: 09/07/20 12:10 PM  Result Value Ref Range   ABO/RH(D) A POS    Antibody Screen NEG    Sample Expiration 09/21/2020,2359    Extend sample reason      NO TRANSFUSIONS OR PREGNANCY IN THE PAST 3 MONTHS Performed at [facility], [address] [place], [place] [address]   CBC     Status: None   Collection Time: 09/07/20 12:10 PM  Result Value Ref Range   WBC 6.3 4.0 - 10.5 K/uL   RBC 4.22 3.87 - 5.11 MIL/uL   Hemoglobin 12.5 12.0 - 15.0 g/dL   HCT 37.2 36.0 - 46.0 %   MCV 88.2 80.0 - 100.0 fL   MCH 29.6 26.0 - 34.0 pg   MCHC 33.6 30.0 - 36.0 g/dL   RDW 11.6 11.5 - 15.5 %   Platelets 277 150 - 400 K/uL   nRBC 0.0 0.0 - 0.2 %    Comment: Performed at [facility], [address] [place], [place] [address]  Basic metabolic panel     Status: None   Collection Time: 09/07/20 12:10 PM  Result Value Ref Range   Sodium 137 135 - 145 mmol/L   Potassium 3.8 3.5 - 5.1 mmol/L   Chloride 106 98 - 111 mmol/L   CO2 26  22 - 32 mmol/L   Glucose, Bld 95 70 - 99 mg/dL    Comment: Glucose reference range applies only to samples taken after fasting for at least 8 hours.   BUN 9 6 - 20 mg/dL   Creatinine, Ser 0.66 0.44 - 1.00 mg/dL   Calcium 9.4 8.9 - 10.3 mg/dL   GFR, Estimated >60 >60 mL/min    Comment: (NOTE) Calculated using the CKD-EPI Creatinine Equation (2021)    Anion gap 5 5 - 15    Comment: Performed at [facility], [place], [place] [address]  SARS CORONAVIRUS 2 (TAT 6-24 HRS) Nasopharyngeal Nasopharyngeal Swab     Status: None   Collection Time: 09/11/20 11:31 AM   Specimen: Nasopharyngeal Swab  Result Value Ref Range   SARS Coronavirus 2 NEGATIVE NEGATIVE    Comment: (NOTE) SARS-CoV-2 target nucleic acids are NOT DETECTED.  The SARS-CoV-2 RNA is generally detectable in upper and lower respiratory specimens during the acute phase of infection. Negative results do not preclude SARS-CoV-2 infection, do not rule out co-infections with other pathogens, and should not be used as the sole basis for treatment or other patient management decisions. Negative results must be combined with clinical observations, patient history, and epidemiological information. The expected result is Negative.  Fact Sheet for Patients:  SugarRoll.be  Fact Sheet for Healthcare Providers: https://www.woods-mathews.com/  This test is not yet approved or cleared by the [place] FDA and  has been authorized for detection and/or diagnosis of SARS-CoV-2 by FDA under an Emergency Use Authorization (EUA). This EUA will remain  in effect (meaning this test can be used) for the duration of the COVID-19 declaration under Se ction 564(b)(1) of the Act, 21 U.S.C. section 360bbb-3(b)(1), unless the authorization is terminated or revoked sooner.  Performed at [facility], [place] [address], [place], [place] [address]   Pregnancy, urine POC      Status: None   Collection Time: 09/13/20  6:15 AM  Result Value Ref Range   Preg Test, Ur NEGATIVE NEGATIVE    Comment:        THE SENSITIVITY OF THIS METHODOLOGY IS >24 mIU/mL   ABO/Rh     Status: None   Collection Time: 09/13/20  6:28 AM  Result Value Ref Range   ABO/RH(D)      A POS Performed at [facility], [address], [place], [place] [address]   Surgical pathology     Status: None   Collection Time: 09/13/20  8:25 AM  Result Value Ref Range   SURGICAL PATHOLOGY      SURGICAL PATHOLOGY CASE: [id number] PATIENT: Valerie Valdez Surgical Pathology Report     Specimen Submitted: A. Uterus, Cervix  Clinical History: AUB, uterine fibroids      DIAGNOSIS: A. 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 - SECRETORY ENDOMETRIUM WITH 0.9 CM ENDOMETRIAL POLYP. - MYOMETRIUM WITH ADENOMYOSIS AND 0.6 CM INTRAMURAL LEIOMYOMA. - ANTERIOR LOWER UTERINE SEGMENT WITH 2.2 CM DILATED AREA CONTAINING BLOOD. - POSTERIOR SEROSA WITH FOCAL VASCULAR CONGESTION. - NEGATIVE FOR ATYPIA AND MALIGNANCY.  Comment: The significance of the 2.2 cm dilated area containing blood at the anterior lower uterine segment is unclear. This might be an area of prior scarring leading to focal distortion of the endometrial cavity. Correlation with clinical impression is required.   GROSS DESCRIPTION: A. Labeled: Uterus and cervix Received: Formalin Collection time: 8:44 AM on 09/13/2020 Placed into formalin  time: Not provided Weight: 108 grams Dimensions:      Fundus -9.4 (superior to inferior) x 6.2 (breadth of uterus at fundus) x 3.4 (anterior to posterior) cm      Cervix -4.2 x 4.1 cm Serosa: The serosa is tan-pink, smooth, and glistening with areas of prominent vessels on the posterior body. Cervix: The cervix is white-pink, smooth, and pearly. Endocervix: The endocervix is tan, mucoid, and finely granular with a 0.6 cm  slightly rounded endocervical os. Endometrial cavity:      Dimensions -4.1 (superior to inferior) x 3.2 (cornu to cornu) cm      Thickness -Ranges from 0.2-0.3 cm      Other findings -the endometrium is tan and finely granular with a 0.9 x 0.7 x 0.3 cm polyp at the left cornu. At the anterior lower uterine segment there is a 2.2 x 1.6 x 1 cm dilated cavity-like area, which contains an abundant amount of blood and blood clot. Myometrium:     Thickness -2.2 cm     Other findings -the myometrium is tan-pink and soft with a single 0.6 x 0.6 x 0.5 cm i ntramural nodule. The nodule is pink-white, whorled, firm, and well-circumscribed. Other comments: None grossly appreciated.  Block summary: 1 - representative anterior cervix/endocervix 2 - representative posterior cervix/endocervix 3 - representative anterior transmural  endomyometrium 4 - representative posterior transmural endomyometrium 5 - representative serosal vessels, endometrial polyp, and intramural nodule 6 - representative cavity-like area in the endometrium  RB 09/13/2020   Final Diagnosis performed by Quay Burow, MD.   Electronically signed 09/14/2020 10:23:09AM The electronic signature indicates that the named Attending Pathologist has evaluated the specimen Technical component performed at [facility], [address] [place], [place] [address] Lab: [phone] Dir: Rush Farmer, MD, MMM  Professional component performed at [facility], [place], [place], [place] [address] Lab: [phone] Dir: Dellia Nims. Rubinas, MD      Treatments: surgery: TLH, BS, cystoscopy  Discharge Exam: Blood pressure 102/62, pulse 60, temperature 98.8 F (37.1 C), resp. rate 20, height 5\' 4"  (1.626 m), weight 77.1 kg, last menstrual period 08/25/2020, SpO2 99 %. General appearance: alert, appears stated age and no distress Resp: no increased work of breathing GI: soft, non-tender; bowel sounds normal; no  masses,  no organomegaly Incision/Wound:D/C/I  Disposition: Discharge disposition: 01-Home or Self Care       Discharge Instructions    Call MD for:   Complete by: As directed    Heavy vaginal bleeding greater than 1 pad an hour   Call MD for:  difficulty breathing, headache or visual disturbances   Complete by: As directed    Call MD for:  extreme fatigue   Complete by: As directed    Call MD for:  hives   Complete by: As directed    Call MD for:  persistant dizziness or light-headedness   Complete by: As directed    Call MD for:  persistant nausea and vomiting   Complete by: As directed    Call MD for:  redness, tenderness, or signs of infection (pain, swelling, redness, odor or green/yellow discharge around incision site)   Complete by: As directed    Call MD for:  severe uncontrolled pain   Complete by: As directed    Call MD for:  temperature >100.4   Complete by: As directed    Diet general   Complete by: As directed    Discharge wound care:   Complete by: As directed    You may apply a light dressing for minor discharge from the incision or to keep waistbands of clothing from rubbing.  You may shower, use soap on your incision.  Avoid baths or soaking the incision in the first 6 weeks following your surgery..   Driving restriction   Complete by: As directed    Avoid driving for at least 2 weeks or while taking prescription narcotics.   Lifting restrictions   Complete by: As directed    Weight restriction of 10lbs for 6 weeks.     Allergies as of 09/13/2020   No Known Allergies     Medication List    TAKE these medications   fluocinonide 0.05 % external solution Commonly known as: LIDEX Apply 1 application topically 2 (two) times daily as needed (scalp).   ibuprofen 600 MG tablet Commonly known as: ADVIL Take 1 tablet (600 mg total) by mouth every 6 (six) hours as needed.   ketoconazole 2 % shampoo Commonly known as: NIZORAL Apply 1 application  topically daily as needed for irritation.   multivitamin with minerals tablet Take 1 tablet by mouth daily.   omeprazole 20 MG capsule Commonly known as: PRILOSEC Take 20 mg by mouth daily as needed (acid reflux).   traZODone 50 MG tablet Commonly known as: DESYREL Take 50 mg by  mouth at bedtime.            Discharge Care Instructions  (From admission, onward)         Start     Ordered   09/13/20 0000  Discharge wound care:       Comments: You may apply a light dressing for minor discharge from the incision or to keep waistbands of clothing from rubbing.  You may shower, use soap on your incision.  Avoid baths or soaking the incision in the first 6 weeks following your surgery.Marland Kitchen   09/13/20 1446          Follow-up Information    Malachy Mood, MD Follow up in 1 week(s).   Specialty: Obstetrics and Gynecology Why: For wound re-check 09/20/2020 Thursday March 10th at [place] AM Contact information: [address] [place] [address] [phone]               Signed: Malachy Mood 10/10/2020, 10:38 AM

## 2020-10-18 ENCOUNTER — Other Ambulatory Visit: Payer: Self-pay | Admitting: Family Medicine

## 2020-10-18 DIAGNOSIS — R2231 Localized swelling, mass and lump, right upper limb: Secondary | ICD-10-CM

## 2020-10-23 ENCOUNTER — Other Ambulatory Visit: Payer: Self-pay

## 2020-10-23 ENCOUNTER — Ambulatory Visit (INDEPENDENT_AMBULATORY_CARE_PROVIDER_SITE_OTHER): Payer: BC Managed Care – PPO | Admitting: Obstetrics and Gynecology

## 2020-10-23 ENCOUNTER — Encounter: Payer: Self-pay | Admitting: Obstetrics and Gynecology

## 2020-10-23 VITALS — BP 110/68 | Wt 164.0 lb

## 2020-10-23 DIAGNOSIS — Z4889 Encounter for other specified surgical aftercare: Secondary | ICD-10-CM

## 2020-10-23 NOTE — Progress Notes (Signed)
Postoperative Follow-up Patient presents post op from [facility], cystoscopy 6weeks ago for abnormal uterine bleeding.  Subjective: Patient reports some improvement in her preop symptoms. Eating a regular diet without difficulty. The patient is not having any pain.  Activity: normal activities of daily living.  Still feels like she is not emptying well with voiding.  Feels decreased force of urinary stream.  No dysuria, no urgency.    Objective: Blood pressure 110/68, weight 164 lb (74.4 kg).  General: NAD Pulmonary: no increased work of breathing GU: normal external female genitalia vaginal cuff intact, well healed Extremities: no edema Neurologic: normal gait  PVR 67mL  Admission on 09/13/2020, Discharged on 09/13/2020  Component Date Value Ref Range Status  . ABO/RH(D) 09/13/2020    Final                   Value:A POS Performed at [facility], [address], [place], [place] [address]   . Preg Test, Ur 09/13/2020 NEGATIVE  NEGATIVE Final   Comment:        THE SENSITIVITY OF THIS METHODOLOGY IS >24 mIU/mL   . SURGICAL PATHOLOGY 09/13/2020    Final-Edited                   Value:SURGICAL PATHOLOGY CASE: [id number] PATIENT: [facility] Surgical Pathology Report     Specimen Submitted: A. Uterus, Cervix  Clinical History: AUB, uterine fibroids      DIAGNOSIS: A. 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 - SECRETORY ENDOMETRIUM WITH 0.9 CM ENDOMETRIAL POLYP. - MYOMETRIUM WITH ADENOMYOSIS AND 0.6 CM INTRAMURAL LEIOMYOMA. - ANTERIOR LOWER UTERINE SEGMENT WITH 2.2 CM DILATED AREA CONTAINING BLOOD. - POSTERIOR SEROSA WITH FOCAL VASCULAR CONGESTION. - NEGATIVE FOR ATYPIA AND MALIGNANCY.  Comment: The significance of the 2.2 cm dilated area containing blood at the anterior lower uterine segment is unclear. This might be an area of prior scarring leading to focal distortion of the endometrial  cavity. Correlation with clinical impression is required.   GROSS DESCRIPTION: A. Labeled: Uterus and cervix Received: Formalin Collection time: 8:44 AM on 09/13/2020 Placed into formalin                          time: Not provided Weight: 108 grams Dimensions:      Fundus -9.4 (superior to inferior) x 6.2 (breadth of uterus at fundus) x 3.4 (anterior to posterior) cm      Cervix -4.2 x 4.1 cm Serosa: The serosa is tan-pink, smooth, and glistening with areas of prominent vessels on the posterior body. Cervix: The cervix is white-pink, smooth, and pearly. Endocervix: The endocervix is tan, mucoid, and finely granular with a 0.6 cm slightly rounded endocervical os. Endometrial cavity:      Dimensions -4.1 (superior to inferior) x 3.2 (cornu to cornu) cm      Thickness -Ranges from 0.2-0.3 cm      Other findings -the endometrium is tan and finely granular with a 0.9 x 0.7 x 0.3 cm polyp at the left cornu. At the anterior lower uterine segment there is a 2.2 x 1.6 x 1 cm dilated cavity-like area, which contains an abundant amount of blood and blood clot. Myometrium:     Thickness -2.2 cm     Other findings -the myometrium is tan-pink and soft with a single 0.6 x 0.6 x 0.5 cm i  ntramural nodule. The nodule is pink-white, whorled, firm, and well-circumscribed. Other comments: None grossly appreciated.  Block summary: 1 - representative anterior cervix/endocervix 2 - representative posterior cervix/endocervix 3 - representative anterior transmural endomyometrium 4 - representative posterior transmural endomyometrium 5 - representative serosal vessels, endometrial polyp, and intramural nodule 6 - representative cavity-like area in the endometrium  RB 09/13/2020   Final Diagnosis performed by Quay Burow, MD.   Electronically signed 09/14/2020 10:23:09AM The electronic signature indicates that the named Attending Pathologist has evaluated the  specimen Technical component performed at [facility], [address], [place], [place] [address] Lab: [phone] Dir: Rush Farmer, MD, MMM  Professional component performed at [facility], [facility], [place], [place], [place] [address] Lab: [phone] Dir: Dellia Nims. Rubinas, MD     Assessment: 40 y.o. s/p TLH, cystoscopy stable  Plan: Patient has done well after surgery with no apparent complications.  I have discussed the post-operative course to date, and the expected progress moving forward.  The patient understands what complications to be concerned about.  I will see the patient in routine follow up, or sooner if needed.    Activity plan: No restriction.  PVR is right at 13mL.  Discussed most women her age would probably be able to have a PVR under 37mL.  Some of this may still be postoperative.  Will re-evaluate in 3 months to see if improvement in sensation of emptying and repeat PVR.   Malachy Mood, MD, [facility], [facility] 10/23/2020, 11:48 AM

## 2020-10-29 ENCOUNTER — Other Ambulatory Visit: Payer: Self-pay | Admitting: Family Medicine

## 2020-10-29 ENCOUNTER — Other Ambulatory Visit: Payer: Self-pay

## 2020-10-29 ENCOUNTER — Ambulatory Visit (HOSPITAL_COMMUNITY)
Admission: RE | Admit: 2020-10-29 | Discharge: 2020-10-29 | Disposition: A | Discharge status: Home / Self Care | Payer: BC Managed Care – PPO | Source: Ambulatory Visit | Attending: Family Medicine | Admitting: Family Medicine

## 2020-10-29 DIAGNOSIS — R2231 Localized swelling, mass and lump, right upper limb: Secondary | ICD-10-CM

## 2020-10-29 IMAGING — US US EXTREM UP *R* LTD
1 series · 14 of 25 positions shown · non-contrast
Comparison: None.

CLINICAL DATA: Palpable abnormality at the palmar base of the right
long finger

EXAM:
ULTRASOUND RIGHT UPPER EXTREMITY LIMITED
TECHNIQUE: Ultrasound examination of the upper extremity soft tissues was
performed in the area of clinical concern.

[Series 1: us extrem up *right* ltd · 26 acquisitions, 14 frames shown]
[im 1/26]
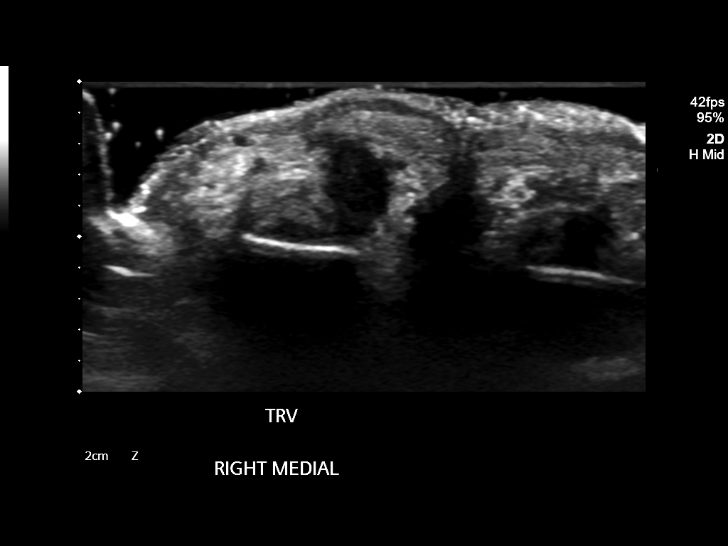
[im 3/26]
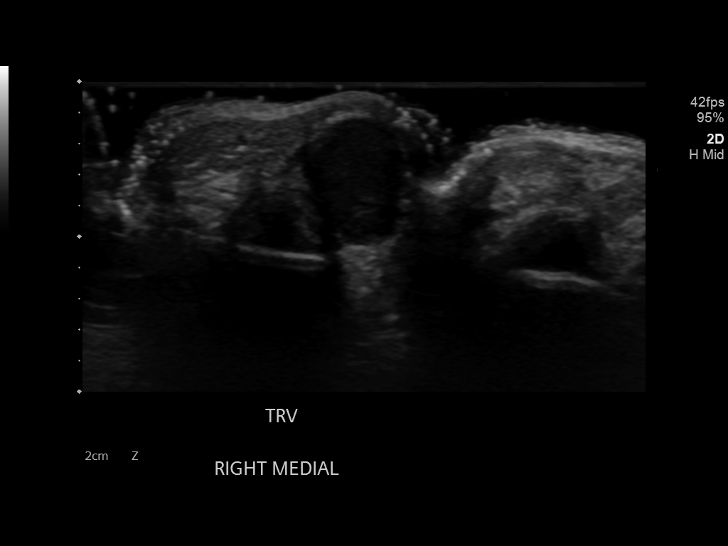
[im 5/26]
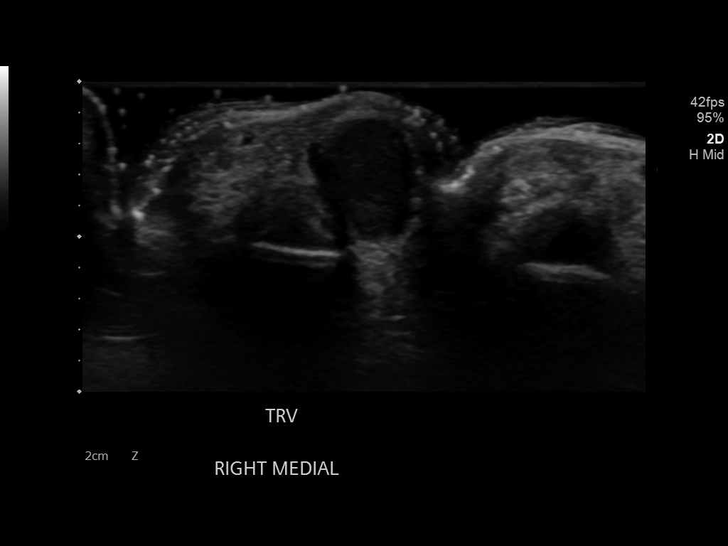
[im 7/26]
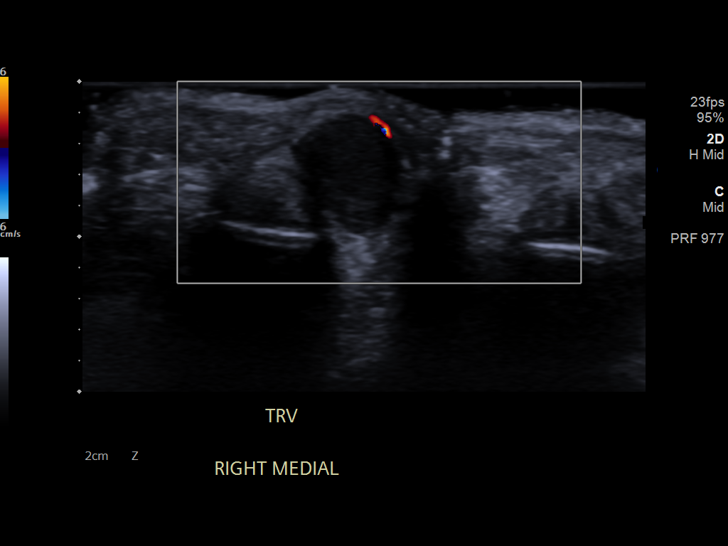
[im 9/26]
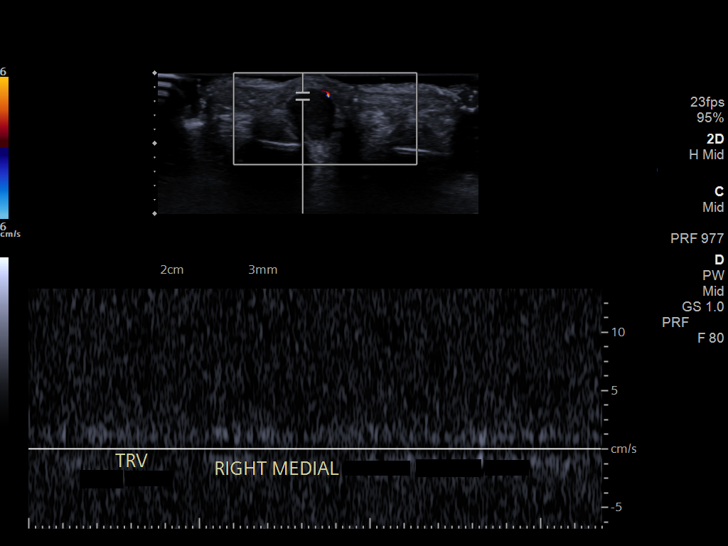
[im 10/26]
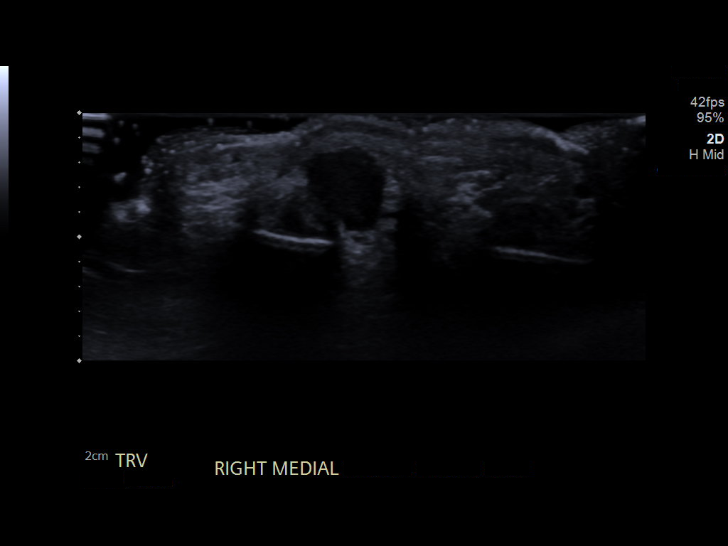
[im 12/26]
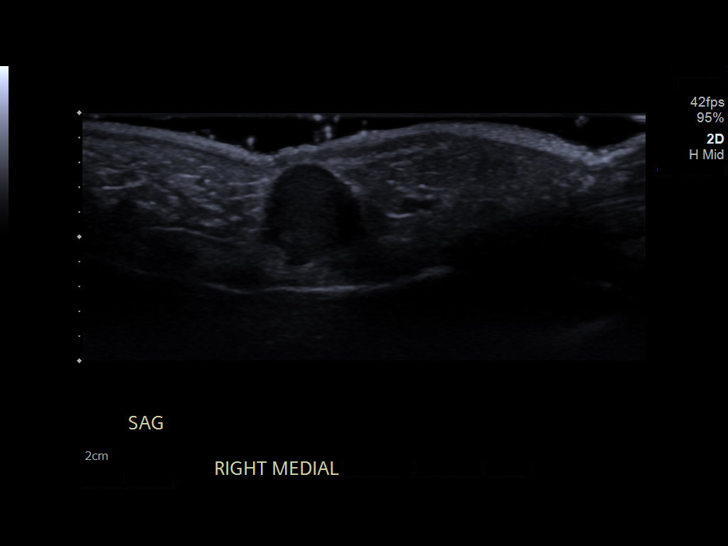
[im 14/26]
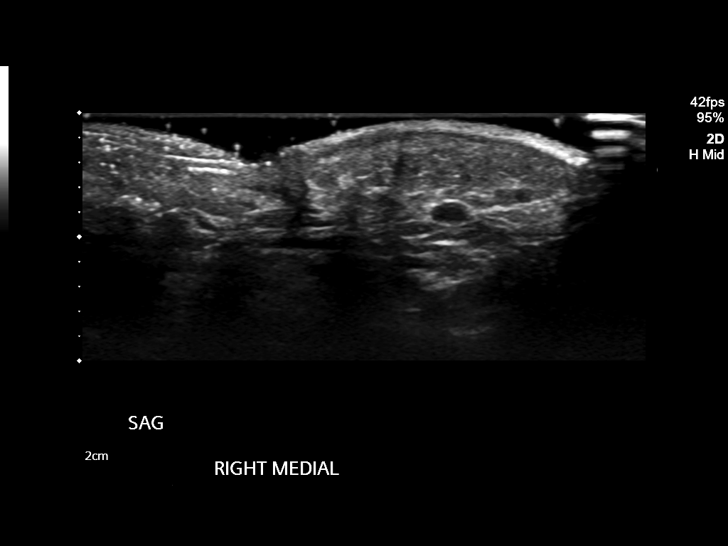
[im 16/26]
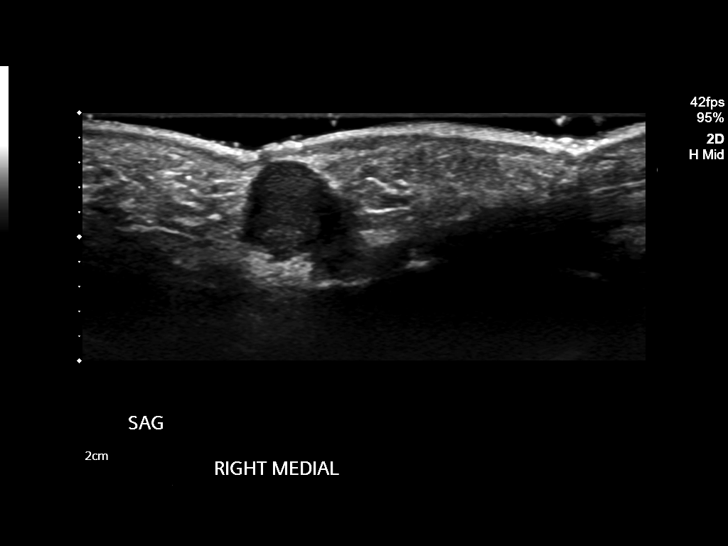
[im 17/26]
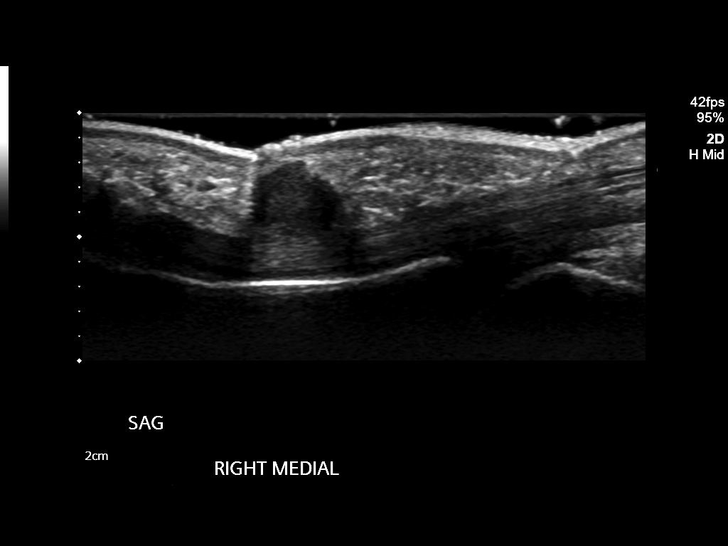
[im 19/26]
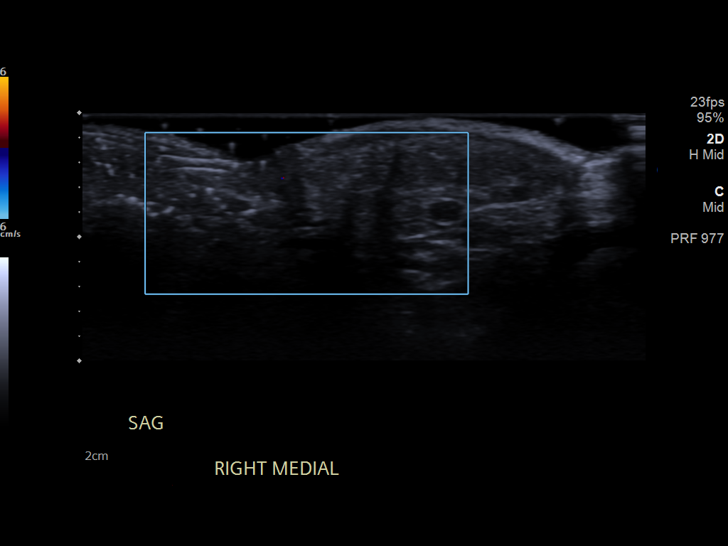
[im 21/26]
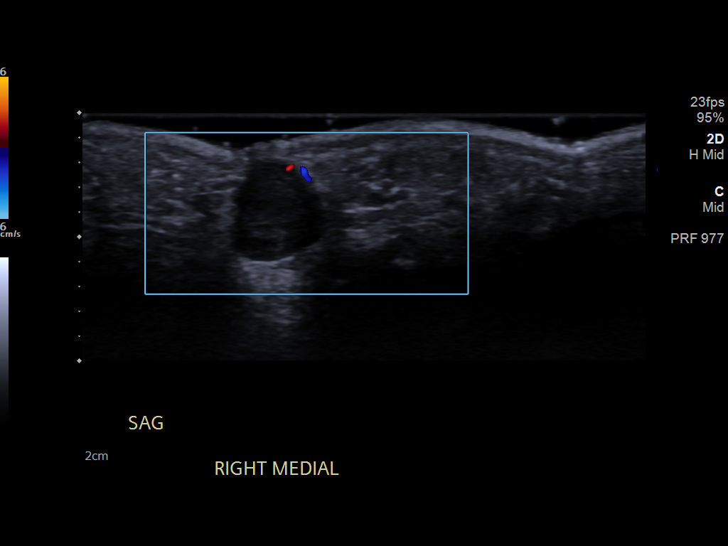
[im 23/26]
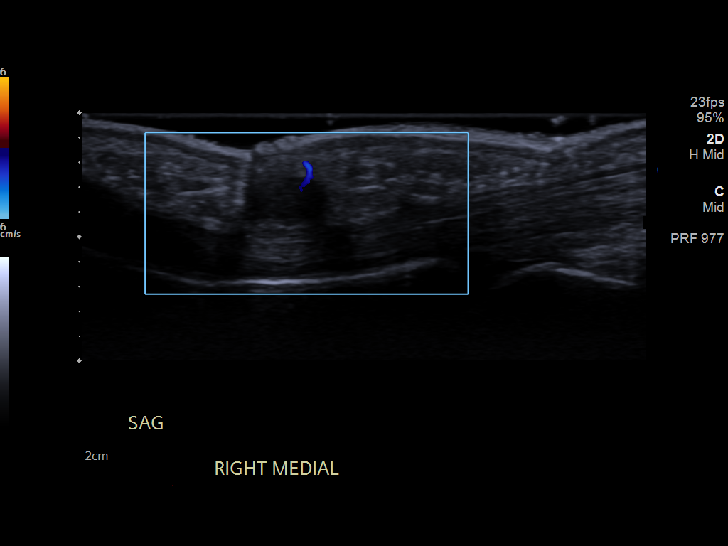
[im 26/26]
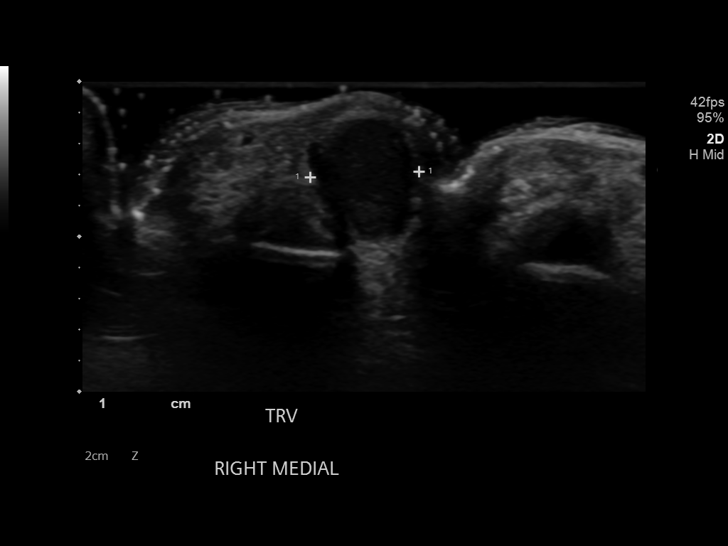

[14 of 25 positions shown; findings below may reference images not displayed]

FINDINGS: Targeted ultrasound was performed of the palmar soft tissues of the
right long finger near the third MCP joint. There is a
well-circumscribed 8 x 7 x 8 mm homogeneously hypoechoic soft tissue
mass abutting the long finger flexor tendons along the ulnar aspect
just distal to the level of the third MCP joint. Internal
vascularity is present within this lesion. Limited evaluation of the
adjacent flexor tendon demonstrates preserved fibrillar echotexture
and no tenosynovial fluid.
IMPRESSION: 8 mm hypoechoic soft tissue mass with internal vascularity adjacent
to the flexor tendon of the right long finger just distal to the
third MCP joint. Imaging features favor giant cell tumor of tendon
sheath. Nerve sheath tumor or synovial sarcoma could also have a
similar appearance. Surgical excision recommended.

## 2020-11-08 DIAGNOSIS — R2230 Localized swelling, mass and lump, unspecified upper limb: Secondary | ICD-10-CM | POA: Insufficient documentation

## 2020-11-08 DIAGNOSIS — M79641 Pain in right hand: Secondary | ICD-10-CM | POA: Insufficient documentation

## 2020-11-30 ENCOUNTER — Other Ambulatory Visit: Payer: Self-pay | Admitting: Orthopedic Surgery

## 2020-12-31 ENCOUNTER — Other Ambulatory Visit: Payer: Self-pay | Admitting: Family Medicine

## 2020-12-31 DIAGNOSIS — Z1231 Encounter for screening mammogram for malignant neoplasm of breast: Secondary | ICD-10-CM

## 2021-01-16 ENCOUNTER — Ambulatory Visit
Admission: RE | Admit: 2021-01-16 | Discharge: 2021-01-16 | Disposition: A | Discharge status: Home / Self Care | Payer: BC Managed Care – PPO | Source: Ambulatory Visit | Attending: Family Medicine | Admitting: Family Medicine

## 2021-01-16 ENCOUNTER — Other Ambulatory Visit: Payer: Self-pay

## 2021-01-16 DIAGNOSIS — Z1231 Encounter for screening mammogram for malignant neoplasm of breast: Secondary | ICD-10-CM | POA: Insufficient documentation

## 2021-01-16 IMAGING — MG MM DIGITAL SCREENING BILAT W/ TOMO AND CAD
6 of 10 series · 6 of 30 positions shown · non-contrast
Comparison: Previous exam(s).

CLINICAL DATA: Screening.

EXAM:
DIGITAL SCREENING BILATERAL MAMMOGRAM WITH TOMOSYNTHESIS AND CAD
TECHNIQUE: Bilateral screening digital craniocaudal and mediolateral oblique
mammograms were obtained. Bilateral screening digital breast
tomosynthesis was performed. The images were evaluated with
computer-aided detection.

[R MLO synth-2D (1 of 2)]
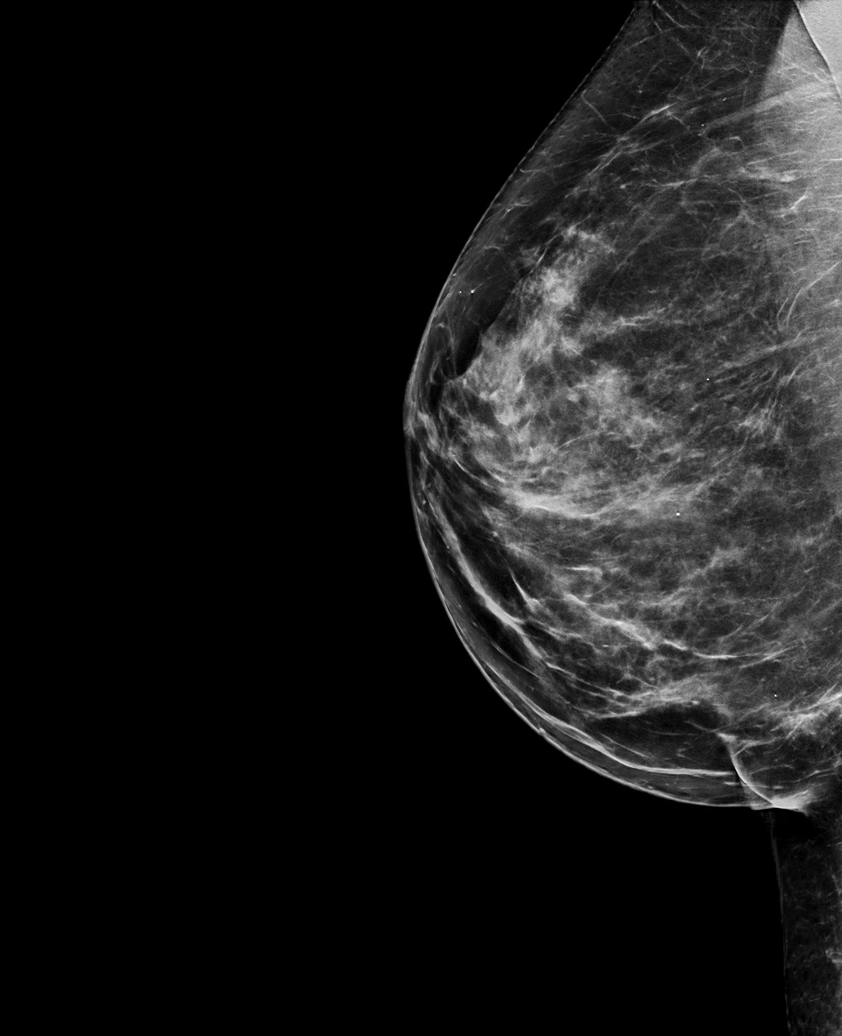

[R MLO synth-2D (2 of 2)]
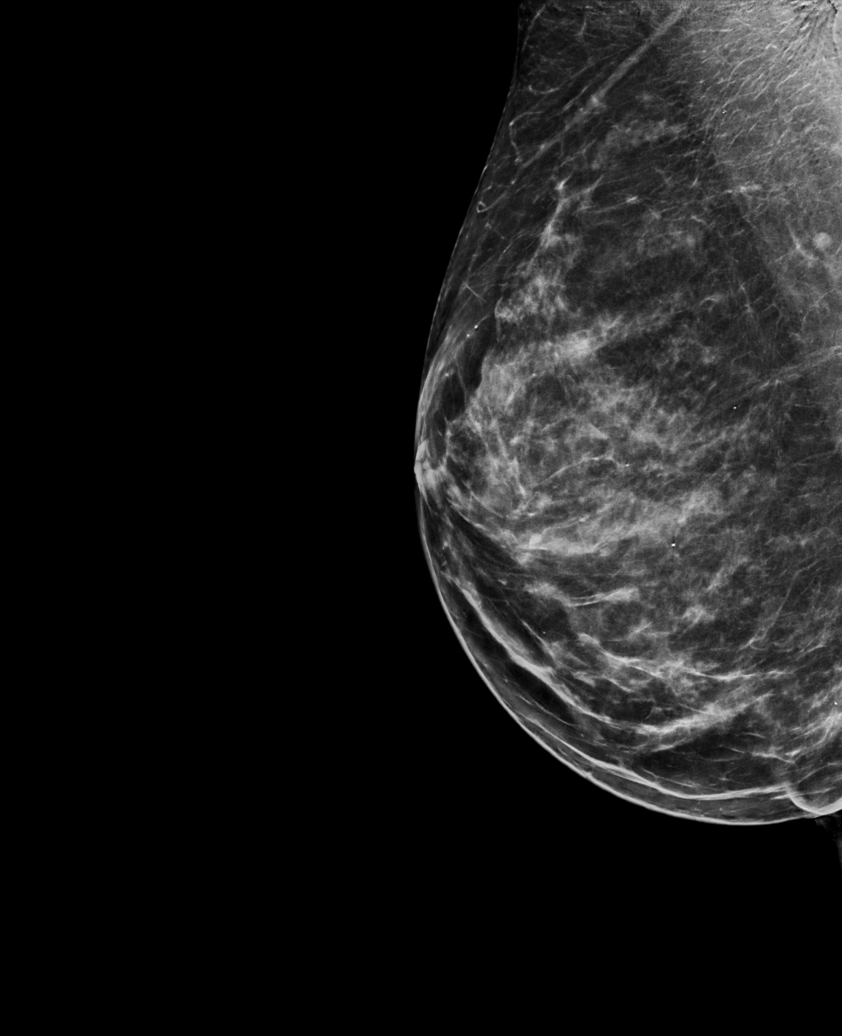

[L MLO synth-2D]
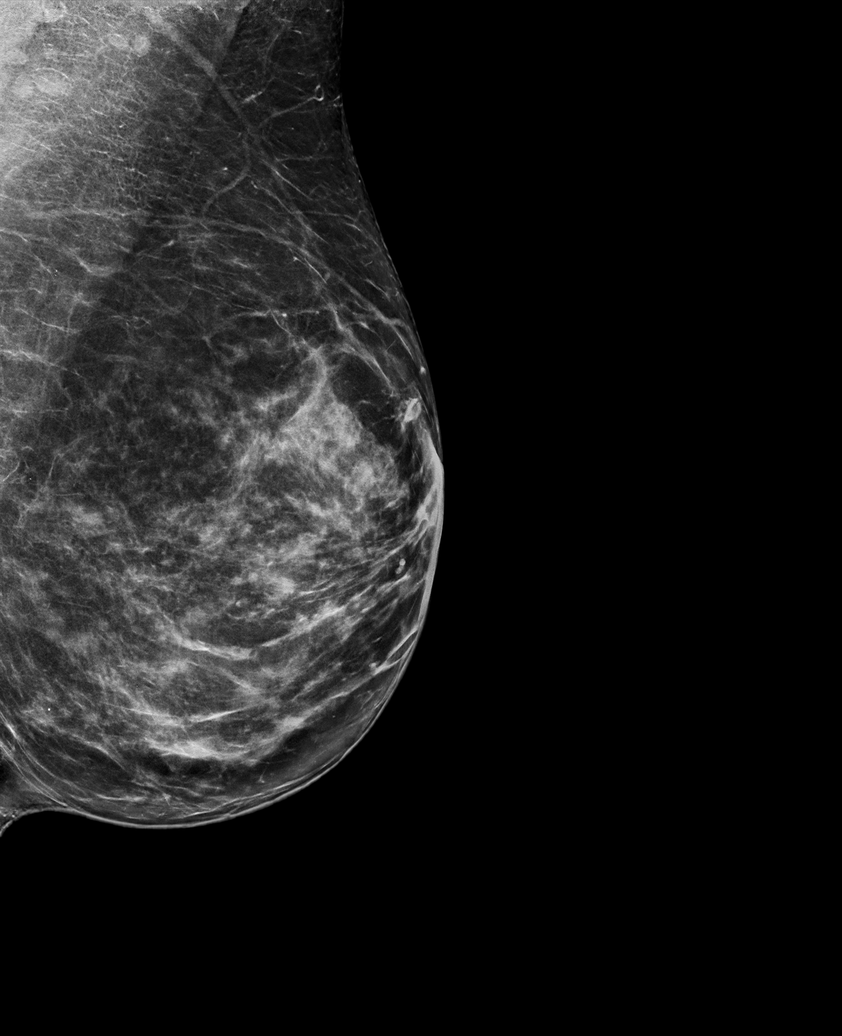

[R CC synth-2D]
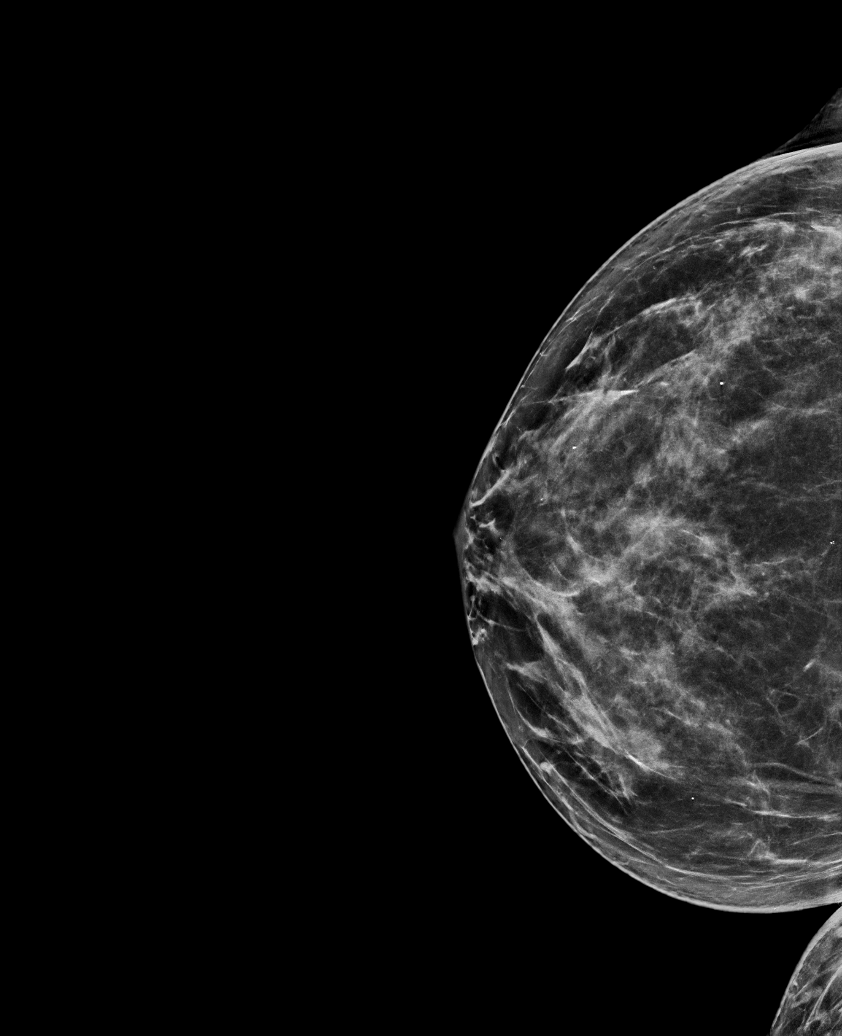

[L CC synth-2D]
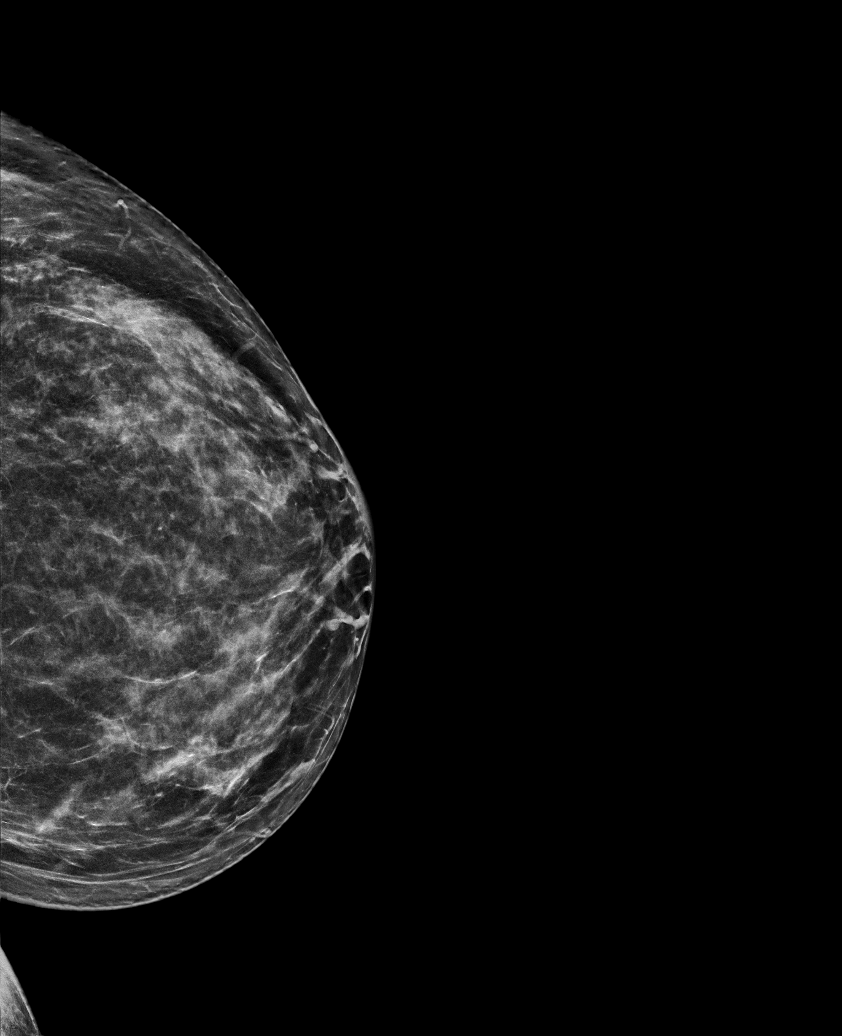

[R MLO tomo · tomo slice 39/78.0]
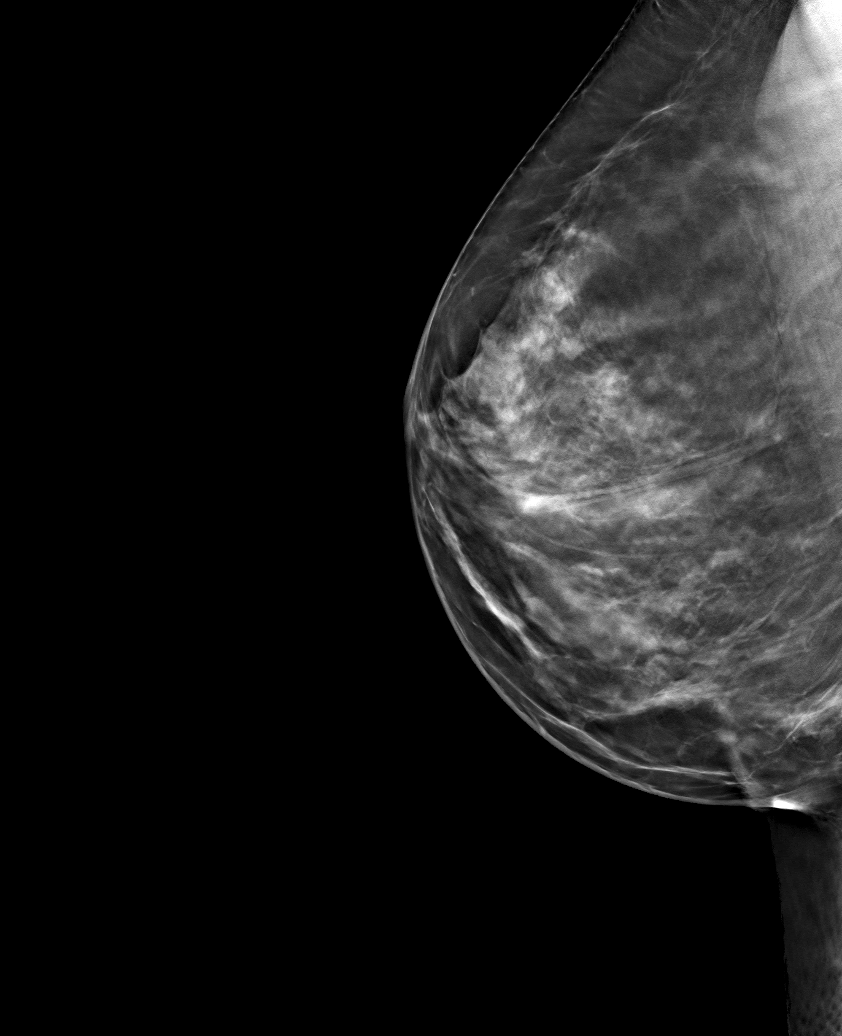

[6 of 30 positions shown; findings below may reference images not displayed]

ACR Breast Density Category c: The breast tissue is heterogeneously
dense, which may obscure small masses.
FINDINGS: There are no findings suspicious for malignancy.
IMPRESSION: No mammographic evidence of malignancy. A result letter of this
screening mammogram will be mailed directly to the patient.

RECOMMENDATION:
Screening mammogram in one year. (Code:[V2])

BI-RADS CATEGORY  1: Negative.

## 2021-01-17 ENCOUNTER — Encounter (INDEPENDENT_AMBULATORY_CARE_PROVIDER_SITE_OTHER): Payer: Self-pay | Admitting: Vascular Surgery

## 2021-01-17 ENCOUNTER — Ambulatory Visit (INDEPENDENT_AMBULATORY_CARE_PROVIDER_SITE_OTHER): Payer: BC Managed Care – PPO | Admitting: Vascular Surgery

## 2021-01-17 DIAGNOSIS — I8310 Varicose veins of unspecified lower extremity with inflammation: Secondary | ICD-10-CM | POA: Diagnosis not present

## 2021-01-17 DIAGNOSIS — M79604 Pain in right leg: Secondary | ICD-10-CM | POA: Diagnosis not present

## 2021-01-17 DIAGNOSIS — M7989 Other specified soft tissue disorders: Secondary | ICD-10-CM | POA: Diagnosis not present

## 2021-01-20 ENCOUNTER — Encounter (INDEPENDENT_AMBULATORY_CARE_PROVIDER_SITE_OTHER): Payer: Self-pay | Admitting: Vascular Surgery

## 2021-01-20 DIAGNOSIS — M7989 Other specified soft tissue disorders: Secondary | ICD-10-CM | POA: Insufficient documentation

## 2021-01-20 DIAGNOSIS — I8310 Varicose veins of unspecified lower extremity with inflammation: Secondary | ICD-10-CM | POA: Insufficient documentation

## 2021-01-20 DIAGNOSIS — M79606 Pain in leg, unspecified: Secondary | ICD-10-CM | POA: Insufficient documentation

## 2021-01-20 NOTE — Progress Notes (Signed)
MRN : [id number]  Valerie Valdez is a 40 y.o. (1980/09/02) female who presents with chief complaint of  Chief Complaint  Patient presents with   New Patient (Initial Visit)    Ref Kary Kos rle swelling  .  History of Present Illness:  The patient is seen for evaluation of symptomatic varicose veins of the right leg. The patient relates burning and stinging which worsened steadily throughout the course of the day, particularly with standing. The patient also notes an aching and throbbing pain over the varicosities, particularly with prolonged dependent positions. The symptoms are significantly improved with elevation.  The patient also notes that during hot weather the symptoms are greatly intensified. The patient states the pain from the varicose veins interferes with work, daily exercise, shopping and household maintenance. At this point, the symptoms are persistent and severe enough that they're having a negative impact on lifestyle and are interfering with daily activities.  There is no history of DVT, PE or superficial thrombophlebitis. There is no history of ulceration or hemorrhage. The patient denies a significant family history of varicose veins.  The patient has not worn graduated compression in the past. At the present time the patient has not been using over-the-counter analgesics. There is no history of prior surgical intervention or sclerotherapy.    Current Meds  Medication Sig   fluocinonide (LIDEX) 0.05 % external solution Apply 1 application topically 2 (two) times daily as needed (scalp).   omeprazole (PRILOSEC) 20 MG capsule Take 20 mg by mouth daily as needed (acid reflux).   traZODone (DESYREL) 50 MG tablet Take 50 mg by mouth at bedtime.    Past Medical History:  Diagnosis Date   Allergy    seasonal   Ectopic pregnancy of left ovary 2012   GERD (gastroesophageal reflux disease)    Insomnia    Psoriasis     Past Surgical History:  Procedure Laterality Date    ABDOMINOPLASTY  2015   APPENDECTOMY     BREAST SURGERY Bilateral 2015   breast lift   CESAREAN SECTION  2007, 2009, 2011   COLONOSCOPY  2009   CYSTOSCOPY  09/13/2020   Procedure: CYSTOSCOPY;  Surgeon: Malachy Mood, MD;  Location: [facility];  Service: Gynecology;;   LAPAROSCOPIC BILATERAL SALPINGECTOMY  2012   LAPAROSCOPIC HYSTERECTOMY Bilateral 09/13/2020   Procedure: TOTAL LAPAROSCOPIC HYSTERECTOMY;  Surgeon: Malachy Mood, MD;  Location: [facility];  Service: Gynecology;  Laterality: Bilateral;   MASTOPEXY  2015   TUBAL LIGATION      Social History Social History   Tobacco Use   Smoking status: Never   Smokeless tobacco: Never  Vaping Use   Vaping Use: Never used  Substance Use Topics   Alcohol use: Yes    Comment: Occassional   Drug use: No    Family History Family History  Problem Relation Age of Onset   Breast cancer Maternal Aunt 53  No family history of bleeding/clotting disorders, porphyria or autoimmune disease   No Known Allergies   REVIEW OF SYSTEMS (Negative unless checked)  Constitutional: [] Weight loss  [] Fever  [] Chills Cardiac: [] Chest pain   [] Chest pressure   [] Palpitations   [] Shortness of breath when laying flat   [] Shortness of breath with exertion. Vascular:  [] Pain in legs with walking   [x] Pain in legs at rest  [] History of DVT   [] Phlebitis   [x] Swelling in legs   [x] Varicose veins   [] Non-healing ulcers Pulmonary:   [] Uses home oxygen   [] Productive cough   []   Hemoptysis   [] Wheeze  [] COPD   [] Asthma Neurologic:  [] Dizziness   [] Seizures   [] History of stroke   [] History of TIA  [] Aphasia   [] Vissual changes   [] Weakness or numbness in arm   [] Weakness or numbness in leg Musculoskeletal:   [] Joint swelling   [x] Joint pain   [] Low back pain Hematologic:  [] Easy bruising  [] Easy bleeding   [] Hypercoagulable state   [] Anemic Gastrointestinal:  [] Diarrhea   [] Vomiting  [] Gastroesophageal reflux/heartburn   [] Difficulty swallowing. Genitourinary:   [] Chronic kidney disease   [] Difficult urination  [] Frequent urination   [] Blood in urine Skin:  [] Rashes   [] Ulcers  Psychological:  [] History of anxiety   []  History of major depression.  Physical Examination  Vitals:   01/17/21 1524  BP: 109/72  Pulse: 68  Resp: 16  Weight: 159 lb 3.2 oz (72.2 kg)  Height: 5\' 4"  (1.626 m)   Body mass index is 27.33 kg/m. Gen: WD/WN, NAD Head: [place]/AT, No temporalis wasting.  Ear/Nose/Throat: Hearing grossly intact, nares w/o erythema or drainage, poor dentition Eyes: PER, EOMI, sclera nonicteric.  Neck: Supple, no masses.  No bruit or JVD.  Pulmonary:  Good air movement, clear to auscultation bilaterally, no use of accessory muscles.  Cardiac: RRR, normal S1, S2, no Murmurs. Vascular:  scattered varicosities present bilaterally.  Mild venous stasis changes to the legs bilaterally.  2+ soft pitting edema  Vessel Right Left  Radial Palpable Palpable  PT Palpable Palpable  DP Palpable Palpable  Gastrointestinal: soft, non-distended. No guarding/no peritoneal signs.  Musculoskeletal: M/S 5/5 throughout.  No deformity or atrophy.  Neurologic: CN 2-12 intact. Pain and light touch intact in extremities.  Symmetrical.  Speech is fluent. Motor exam as listed above. Psychiatric: Judgment intact, Mood & affect appropriate for pt's clinical situation. Dermatologic: Mild venous rashes no ulcers noted.  No changes consistent with cellulitis. Lymph : No lichenification or skin changes of chronic lymphedema.  CBC Lab Results  Component Value Date   WBC 6.3 09/07/2020   HGB 12.5 09/07/2020   HCT 37.2 09/07/2020   MCV 88.2 09/07/2020   PLT 277 09/07/2020    BMET    Component Value Date/Time   NA 137 09/07/2020 1210   K 3.8 09/07/2020 1210   CL 106 09/07/2020 1210   CO2 26 09/07/2020 1210   GLUCOSE 95 09/07/2020 1210   BUN 9 09/07/2020 1210   CREATININE 0.66 09/07/2020 1210   CALCIUM 9.4 09/07/2020 1210   GFRNONAA >60 09/07/2020 1210   CrCl  cannot be calculated (Patient's most recent lab result is older than the maximum 21 days allowed.).  COAG No results found for: INR, PROTIME  Radiology MM 3D SCREEN BREAST BILATERAL  Result Date: 01/18/2021 CLINICAL DATA:  Screening. EXAM: DIGITAL SCREENING BILATERAL MAMMOGRAM WITH TOMOSYNTHESIS AND CAD TECHNIQUE: Bilateral screening digital craniocaudal and mediolateral oblique mammograms were obtained. Bilateral screening digital breast tomosynthesis was performed. The images were evaluated with computer-aided detection. COMPARISON:  Previous exam(s). ACR Breast Density Category c: The breast tissue is heterogeneously dense, which may obscure small masses. FINDINGS: There are no findings suspicious for malignancy. IMPRESSION: No mammographic evidence of malignancy. A result letter of this screening mammogram will be mailed directly to the patient. RECOMMENDATION: Screening mammogram in one year. (Code:SM-B-01Y) BI-RADS CATEGORY  1: Negative. Electronically Signed   By: Kristopher Oppenheim M.D.   On: 01/18/2021 11:03     Assessment/Plan 1. Varicose veins with inflammation  Recommend:  The patient has large symptomatic varicose veins  that are painful and associated with swelling.  I have had a long discussion with the patient regarding  varicose veins and why they cause symptoms.  Patient will begin wearing graduated compression stockings class 1 on a daily basis, beginning first thing in the morning and removing them in the evening. The patient is instructed specifically not to sleep in the stockings.    The patient  will also begin using over-the-counter analgesics such as Motrin 600 mg po TID to help control the symptoms.    In addition, behavioral modification including elevation during the day will be initiated.    Pending the results of these changes the  patient will be reevaluated in three months.   An  ultrasound of the venous system will be obtained.   Further plans will be based on the  ultrasound results and whether conservative therapies are successful at eliminating the pain and swelling.   - VAS [place] LOWER EXTREMITY VENOUS REFLUX; Future  2. Pain of right lower extremity  Recommend:  The patient has large symptomatic varicose veins that are painful and associated with swelling.  I have had a long discussion with the patient regarding  varicose veins and why they cause symptoms.  Patient will begin wearing graduated compression stockings class 1 on a daily basis, beginning first thing in the morning and removing them in the evening. The patient is instructed specifically not to sleep in the stockings.    The patient  will also begin using over-the-counter analgesics such as Motrin 600 mg po TID to help control the symptoms.    In addition, behavioral modification including elevation during the day will be initiated.    Pending the results of these changes the  patient will be reevaluated in three months.   An  ultrasound of the venous system will be obtained.   Further plans will be based on the ultrasound results and whether conservative therapies are successful at eliminating the pain and swelling.   - VAS [place] LOWER EXTREMITY VENOUS REFLUX; Future  3. Leg swelling  Recommend:  The patient has large symptomatic varicose veins that are painful and associated with swelling.  I have had a long discussion with the patient regarding  varicose veins and why they cause symptoms.  Patient will begin wearing graduated compression stockings class 1 on a daily basis, beginning first thing in the morning and removing them in the evening. The patient is instructed specifically not to sleep in the stockings.    The patient  will also begin using over-the-counter analgesics such as Motrin 600 mg po TID to help control the symptoms.    In addition, behavioral modification including elevation during the day will be initiated.    Pending the results of these changes the  patient will  be reevaluated in three months.   An  ultrasound of the venous system will be obtained.   Further plans will be based on the ultrasound results and whether conservative therapies are successful at eliminating the pain and swelling.   - VAS [place] LOWER EXTREMITY VENOUS REFLUX; Future    Hortencia Pilar, MD  01/20/2021 10:18 AM

## 2021-01-29 ENCOUNTER — Ambulatory Visit: Payer: BC Managed Care – PPO | Admitting: Obstetrics and Gynecology

## 2021-02-07 ENCOUNTER — Other Ambulatory Visit: Payer: Self-pay

## 2021-02-07 ENCOUNTER — Ambulatory Visit (INDEPENDENT_AMBULATORY_CARE_PROVIDER_SITE_OTHER): Payer: BC Managed Care – PPO

## 2021-02-07 ENCOUNTER — Ambulatory Visit (INDEPENDENT_AMBULATORY_CARE_PROVIDER_SITE_OTHER): Payer: BC Managed Care – PPO | Admitting: Vascular Surgery

## 2021-02-07 VITALS — BP 114/70 | HR 76 | Ht 64.0 in | Wt 159.0 lb

## 2021-02-07 DIAGNOSIS — M79604 Pain in right leg: Secondary | ICD-10-CM

## 2021-02-07 DIAGNOSIS — I8310 Varicose veins of unspecified lower extremity with inflammation: Secondary | ICD-10-CM | POA: Diagnosis not present

## 2021-02-07 DIAGNOSIS — R600 Localized edema: Secondary | ICD-10-CM

## 2021-02-07 DIAGNOSIS — M7989 Other specified soft tissue disorders: Secondary | ICD-10-CM

## 2021-02-07 NOTE — Progress Notes (Signed)
MRN : [id number]  Analyssa Valdez is a 40 y.o. (1980/12/30) female who presents with chief complaint of No chief complaint on file. Marland Kitchen  History of Present Illness:   The patient returns for followup evaluation 3 months after the initial visit. The patient continues to have pain in the right lower extremity but feels it is mostly in her knee region. The pain is lessened with elevation and rest. Graduated compression stockings, Class I (20-30 mmHg), have been worn but the stockings do not eliminate the leg pain. Over-the-counter analgesics do not improve the symptoms.   Venous ultrasound shows normal deep venous system, no evidence of acute or chronic DVT.  Superficial reflux is not present.   No outpatient medications have been marked as taking for the 02/07/21 encounter (Appointment) with Valerie Valdez, Valerie Lory, MD.    Past Medical History:  Diagnosis Date   Allergy    seasonal   Ectopic pregnancy of left ovary 2012   GERD (gastroesophageal reflux disease)    Insomnia    Psoriasis     Past Surgical History:  Procedure Laterality Date   ABDOMINOPLASTY  2015   APPENDECTOMY     BREAST SURGERY Bilateral 2015   breast lift   CESAREAN SECTION  2007, 2009, 2011   COLONOSCOPY  2009   CYSTOSCOPY  09/13/2020   Procedure: CYSTOSCOPY;  Surgeon: Valerie Mood, MD;  Location: [facility];  Service: Gynecology;;   LAPAROSCOPIC BILATERAL SALPINGECTOMY  2012   LAPAROSCOPIC HYSTERECTOMY Bilateral 09/13/2020   Procedure: TOTAL LAPAROSCOPIC HYSTERECTOMY;  Surgeon: Valerie Mood, MD;  Location: [facility];  Service: Gynecology;  Laterality: Bilateral;   MASTOPEXY  2015   TUBAL LIGATION      Social History Social History   Tobacco Use   Smoking status: Never   Smokeless tobacco: Never  Vaping Use   Vaping Use: Never used  Substance Use Topics   Alcohol use: Yes    Comment: Occassional   Drug use: No    Family History Family History  Problem Relation Age of Onset   Breast cancer Maternal  Aunt 50    No Known Allergies   REVIEW OF SYSTEMS (Negative unless checked)  Constitutional: '[]'$ Weight loss  '[]'$ Fever  '[]'$ Chills Cardiac: '[]'$ Chest pain   '[]'$ Chest pressure   '[]'$ Palpitations   '[]'$ Shortness of breath when laying flat   '[]'$ Shortness of breath with exertion. Vascular:  '[]'$ Pain in legs with walking   '[]'$ Pain in legs at rest  '[]'$ History of DVT   '[]'$ Phlebitis   '[]'$ Swelling in legs   '[]'$ Varicose veins   '[]'$ Non-healing ulcers Pulmonary:   '[]'$ Uses home oxygen   '[]'$ Productive cough   '[]'$ Hemoptysis   '[]'$ Wheeze  '[]'$ COPD   '[]'$ Asthma Neurologic:  '[]'$ Dizziness   '[]'$ Seizures   '[]'$ History of stroke   '[]'$ History of TIA  '[]'$ Aphasia   '[]'$ Vissual changes   '[]'$ Weakness or numbness in arm   '[]'$ Weakness or numbness in leg Musculoskeletal:   '[]'$ Joint swelling   '[x]'$ Joint pain   '[]'$ Low back pain Hematologic:  '[]'$ Easy bruising  '[]'$ Easy bleeding   '[]'$ Hypercoagulable state   '[]'$ Anemic Gastrointestinal:  '[]'$ Diarrhea   '[]'$ Vomiting  '[]'$ Gastroesophageal reflux/heartburn   '[]'$ Difficulty swallowing. Genitourinary:  '[]'$ Chronic kidney disease   '[]'$ Difficult urination  '[]'$ Frequent urination   '[]'$ Blood in urine Skin:  '[]'$ Rashes   '[]'$ Ulcers  Psychological:  '[]'$ History of anxiety   '[]'$  History of major depression.  Physical Examination  There were no vitals filed for this visit. There is no height or weight on file to calculate BMI. Gen: WD/WN, NAD  Head: Valerie Valdez/AT, No temporalis wasting.  Ear/Nose/Throat: Hearing grossly intact, nares w/o erythema or drainage Eyes: PER, EOMI, sclera nonicteric.  Neck: Supple, no large masses.   Pulmonary:  Good air movement, no audible wheezing bilaterally, no use of accessory muscles.  Cardiac: RRR, no JVD Vascular:   Scattered varicosities present right more than left.  Mild venous stasis changes to the legs bilaterally.  trace soft pitting edema  Vessel Right Left  Radial Palpable Palpable  Gastrointestinal: Non-distended. No guarding/no peritoneal signs.  Musculoskeletal: M/S 5/5 throughout.  No deformity or atrophy.   Neurologic: CN 2-12 intact. Symmetrical.  Speech is fluent. Motor exam as listed above. Psychiatric: Judgment intact, Valdez & affect appropriate for pt's clinical situation. Dermatologic: No rashes or ulcers noted.  No changes consistent with cellulitis. Lymph : No lichenification or skin changes of chronic lymphedema.  CBC Lab Results  Component Value Date   WBC 6.3 09/07/2020   HGB 12.5 09/07/2020   HCT 37.2 09/07/2020   MCV 88.2 09/07/2020   PLT 277 09/07/2020    BMET    Component Value Date/Time   NA 137 09/07/2020 1210   K 3.8 09/07/2020 1210   CL 106 09/07/2020 1210   CO2 26 09/07/2020 1210   GLUCOSE 95 09/07/2020 1210   BUN 9 09/07/2020 1210   CREATININE 0.66 09/07/2020 1210   CALCIUM 9.4 09/07/2020 1210   GFRNONAA >60 09/07/2020 1210   CrCl cannot be calculated (Patient's most recent lab result is older than the maximum 21 days allowed.).  COAG No results found for: INR, PROTIME  Radiology MM 3D SCREEN BREAST BILATERAL  Result Date: 01/18/2021 CLINICAL DATA:  Screening. EXAM: DIGITAL SCREENING BILATERAL MAMMOGRAM WITH TOMOSYNTHESIS AND CAD TECHNIQUE: Bilateral screening digital craniocaudal and mediolateral oblique mammograms were obtained. Bilateral screening digital breast tomosynthesis was performed. The images were evaluated with computer-aided detection. COMPARISON:  Previous exam(s). ACR Breast Density Category c: The breast tissue is heterogeneously dense, which may obscure small masses. FINDINGS: There are no findings suspicious for malignancy. IMPRESSION: No mammographic evidence of malignancy. A result letter of this screening mammogram will be mailed directly to the patient. RECOMMENDATION: Screening mammogram in one year. (Code:SM-B-01Y) BI-RADS CATEGORY  1: Negative. Electronically Signed   By: Valerie Valdez M.D.   On: 01/18/2021 11:03    Assessment/Plan 1. Varicose veins with inflammation Recommend:  The patient is complaining of varicose veins.     I have had a long discussion with the patient regarding  varicose veins and why they cause symptoms.  Patient will begin wearing graduated compression stockings on a daily basis, beginning first thing in the morning and removing them in the evening. The patient is instructed specifically not to sleep in the stockings.    The patient  will also begin using over-the-counter analgesics such as Motrin 600 mg po TID to help control the symptoms as needed.    In addition, behavioral modification including elevation during the day will be initiated, utilizing a recliner was recommended.  The patient is also instructed to continue exercising such as walking 4-5 times per week.  At this time the patient wishes to continue conservative therapy and is not interested in more invasive treatments such as laser ablation and sclerotherapy.  The Patient will follow up PRN if the symptoms worsen.   2. Pain of right lower extremity I believe the majority of her right leg pain is orthopedic in nature. - Ambulatory referral to [place], MD  02/07/2021 11:09 AM

## 2021-02-10 ENCOUNTER — Encounter (INDEPENDENT_AMBULATORY_CARE_PROVIDER_SITE_OTHER): Payer: Self-pay | Admitting: Vascular Surgery

## 2021-02-13 ENCOUNTER — Other Ambulatory Visit: Payer: Self-pay | Admitting: Orthopedic Surgery

## 2021-02-13 ENCOUNTER — Other Ambulatory Visit (HOSPITAL_COMMUNITY): Payer: Self-pay | Admitting: Orthopedic Surgery

## 2021-02-13 DIAGNOSIS — M7121 Synovial cyst of popliteal space [Baker], right knee: Secondary | ICD-10-CM

## 2021-02-13 DIAGNOSIS — Q686 Discoid meniscus: Secondary | ICD-10-CM

## 2021-02-18 ENCOUNTER — Encounter: Payer: Self-pay | Admitting: Unknown Physician Specialty

## 2021-02-18 NOTE — Discharge Instructions (Signed)
[facility] ENDOSCOPIC SINUS SURGERY [facility]  What is Functional Endoscopic Sinus Surgery?  The Surgery involves making the natural openings of the sinuses larger by removing the bony partitions that separate the sinuses from the nasal cavity.  The natural sinus lining is preserved as much as possible to allow the sinuses to resume normal function after the surgery.  In some patients nasal polyps (excessively swollen lining of the sinuses) may be removed to relieve obstruction of the sinus openings.  The surgery is performed through the nose using lighted scopes, which eliminates the need for incisions on the face.  A septoplasty is a different procedure which is sometimes performed with sinus surgery.  It involves straightening the boy partition that separates the two sides of your nose.  A crooked or deviated septum may need repair if is obstructing the sinuses or nasal airflow.  Turbinate reduction is also often performed during sinus surgery.  The turbinates are bony proturberances from the side walls of the nose which swell and can obstruct the nose in patients with sinus and allergy problems.  Their size can be surgically reduced to help relieve nasal obstruction.  What Can Sinus Surgery Do For Me?  Sinus surgery can reduce the frequency of sinus infections requiring antibiotic treatment.  This can provide improvement in nasal congestion, post-nasal drainage, facial pressure and nasal obstruction.  Surgery will NOT prevent you from ever having an infection again, so it usually only for patients who get infections 4 or more times yearly requiring antibiotics, or for infections that do not clear with antibiotics.  It will not cure nasal allergies, so patients with allergies may still require medication to treat their allergies after surgery. Surgery may improve headaches related to sinusitis, however, some people will continue to  require medication to control sinus headaches related to allergies.  Surgery will do nothing for other forms of headache (migraine, tension or cluster).  What Are the Risks of Endoscopic Sinus Surgery?  Current techniques allow surgery to be performed safely with little risk, however, there are rare complications that patients should be aware of.  Because the sinuses are located around the eyes, there is risk of eye injury, including blindness, though again, this would be quite rare. This is usually a result of bleeding behind the eye during surgery, which puts the vision oat risk, though there are treatments to protect the vision and prevent permanent disrupted by surgery causing a leak of the spinal fluid that surrounds the brain.  More serious complications would include bleeding inside the brain cavity or damage to the brain.  Again, all of these complications are uncommon, and spinal fluid leaks can be safely managed surgically if they occur.  The most common complication of sinus surgery is bleeding from the nose, which may require packing or cauterization of the nose.  Continued sinus have polyps may experience recurrence of the polyps requiring revision surgery.  Alterations of sense of smell or injury to the tear ducts are also rare complications.   What is the Surgery Like, and what is the Recovery?  The Surgery usually takes a couple of hours to perform, and is usually performed under a general anesthetic (completely asleep).  Patients are usually discharged home after a couple of hours.  Sometimes during surgery it is necessary to pack the nose to control bleeding, and the packing is left in place for 24 - 48 hours, and removed by your surgeon.    If a septoplasty was performed during the procedure, there is often a splint placed which must be removed after 5-7 days.   Discomfort: Pain is usually mild to moderate, and can be controlled by prescription pain medication or acetaminophen (Tylenol).   Aspirin, Ibuprofen (Advil, Motrin), or Naprosyn (Aleve) should be avoided, as they can cause increased bleeding.  Most patients feel sinus pressure like they have a bad head cold for several days.  Sleeping with your head elevated can help reduce swelling and facial pressure, as can ice packs over the face.  A humidifier may be helpful to keep the mucous and blood from drying in the nose.   Diet: There are no specific diet restrictions, however, you should generally start with clear liquids and a light diet of bland foods because the anesthetic can cause some nausea.  Advance your diet depending on how your stomach feels.  Taking your pain medication with food will often help reduce stomach upset which pain medications can cause.  Nasal Saline Irrigation: It is important to remove blood clots and dried mucous from the nose as it is healing.  This is done by having you irrigate the nose at least 3 - 4 times daily with a salt water solution.  We recommend using NeilMed Sinus Rinse (available at the drug store).  Fill the squeeze bottle with the solution, bend over a sink, and insert the tip of the squeeze bottle into the nose  of an inch.  Point the tip of the squeeze bottle towards the inside corner of the eye on the same side your irrigating.  Squeeze the bottle and gently irrigate the nose.  If you bend forward as you do this, most of the fluid will flow back out of the nose, instead of down your throat.   The solution should be warm, near body temperature, when you irrigate.   Each time you irrigate, you should use a full squeeze bottle.   Note that if you are instructed to use Nasal Steroid Sprays at any time after your surgery, irrigate with saline BEFORE using the steroid spray, so you do not wash it all out of the nose. Another product, Nasal Saline Gel (such as AYR Nasal Saline Gel) can be applied in each nostril 3 - 4 times daily to moisture the nose and reduce scabbing or crusting.  Bleeding:   Bloody drainage from the nose can be expected for several days, and patients are instructed to irrigate their nose frequently with salt water to help remove mucous and blood clots.  The drainage may be dark red or brown, though some fresh blood may be seen intermittently, especially after irrigation.  Do not blow you nose, as bleeding may occur. If you must sneeze, keep your mouth open to allow air to escape through your mouth.  If heavy bleeding occurs: Irrigate the nose with saline to rinse out clots, then spray the nose 3 - 4 times with Afrin Nasal Decongestant Spray.  The spray will constrict the blood vessels to slow bleeding.  Pinch the lower half of your nose shut to apply pressure, and lay down with your head elevated.  Ice packs over the nose may help as well. If bleeding persists despite these measures, you should notify your doctor.  Do not use the Afrin routinely to control nasal congestion after surgery, as it can result in worsening congestion and may affect healing.     Activity: Return to work varies among patients. Most patients will be   out of work at least 5 - 7 days to recover.  Patient may return to work after they are off of narcotic pain medication, and feeling well enough to perform the functions of their job.  Patients must avoid heavy lifting (over 10 pounds) or strenuous physical for 2 weeks after surgery, so your employer may need to assign you to light duty, or keep you out of work longer if light duty is not possible.  NOTE: you should not drive, operate dangerous machinery, do any mentally demanding tasks or make any important legal or financial decisions while on narcotic pain medication and recovering from the general anesthetic.    Call Your Doctor Immediately if You Have Any of the Following: Bleeding that you cannot control with the above measures Loss of vision, double vision, bulging of the eye or black eyes. Fever over 101 degrees Neck stiffness with severe headache,  fever, nausea and change in mental state. You are always encourage to call anytime with concerns, however, please call with requests for pain medication refills during office hours.  Office Endoscopy: During follow-up visits your doctor will remove any packing or splints that may have been placed and evaluate and clean your sinuses endoscopically.  Topical anesthetic will be used to make this as comfortable as possible, though you may want to take your pain medication prior to the visit.  How often this will need to be done varies from patient to patient.  After complete recovery from the surgery, you may need follow-up endoscopy from time to time, particularly if there is concern of recurrent infection or nasal polyps.   

## 2021-02-20 ENCOUNTER — Ambulatory Visit
Admission: RE | Admit: 2021-02-20 | Discharge: 2021-02-20 | Disposition: A | Discharge status: Home / Self Care | Payer: BC Managed Care – PPO | Source: Ambulatory Visit | Attending: Orthopedic Surgery | Admitting: Orthopedic Surgery

## 2021-02-20 ENCOUNTER — Other Ambulatory Visit: Payer: Self-pay

## 2021-02-20 DIAGNOSIS — M7121 Synovial cyst of popliteal space [Baker], right knee: Secondary | ICD-10-CM

## 2021-02-20 DIAGNOSIS — Q686 Discoid meniscus: Secondary | ICD-10-CM | POA: Insufficient documentation

## 2021-02-20 IMAGING — MR MR KNEE*R* W/O CM
6 series · 40 of 40 positions shown · non-contrast
Comparison: None.

CLINICAL DATA: Right knee pain for the past month. No injury or
prior surgery.

EXAM:
MRI OF THE RIGHT KNEE WITHOUT CONTRAST
TECHNIQUE: Multiplanar, multisequence MR imaging of the knee was performed. No
intravenous contrast was administered.

[Series 3: T2 fat-sat · axial · 4.0mm · 0.50mm/px · z∈[-113,+57]mm · 8 of 35 slices shown (1 of 3)]
[im 1/35]
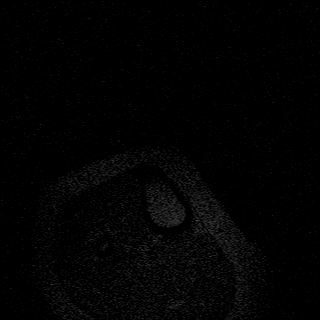
[im 5/35]
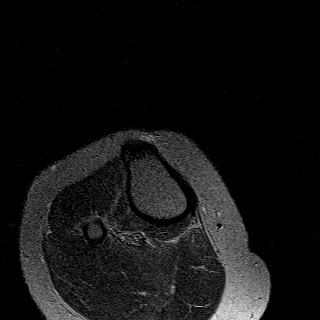
[im 10/35]
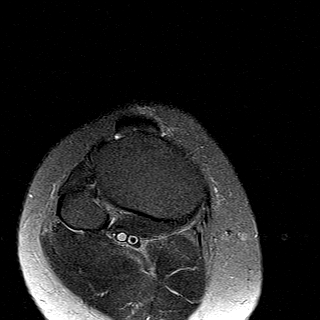
[im 15/35]
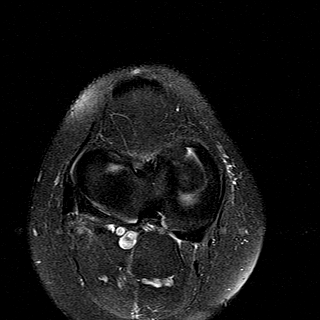
[im 20/35]
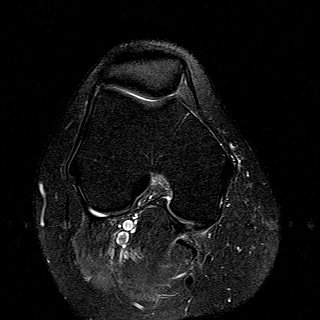
[im 25/35]
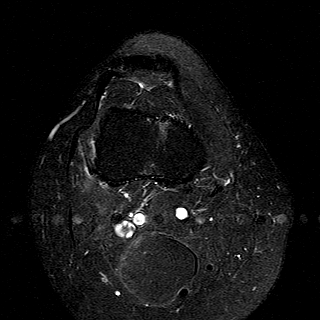
[im 30/35]
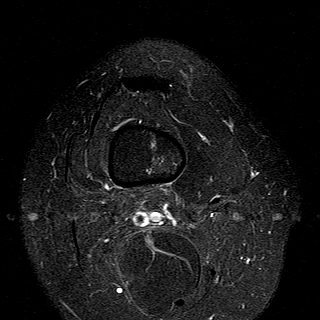
[im 35/35]
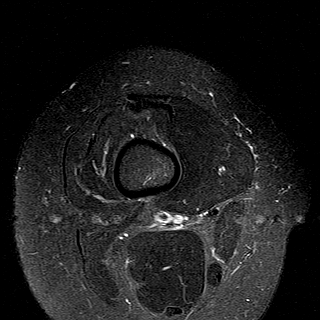

[Series 4: T1 · coronal · 4.0mm · 0.59mm/px · 6 of 22 slices shown]
[im 1/22]
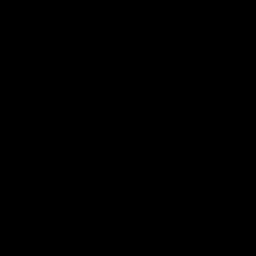
[im 5/22]
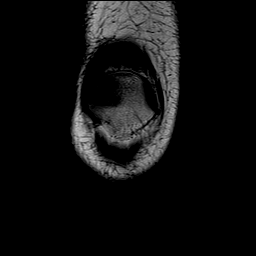
[im 9/22]
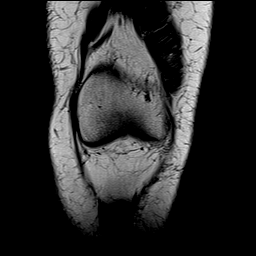
[im 13/22]
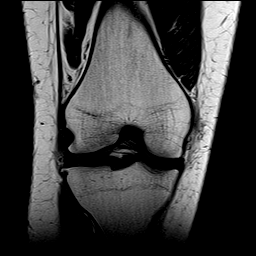
[im 17/22]
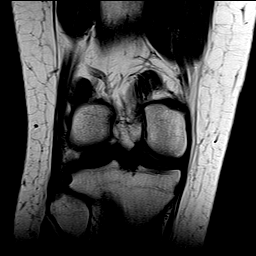
[im 22/22]
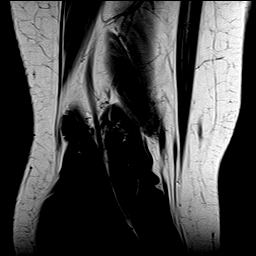

[Series 5: T2 fat-sat · coronal · 4.0mm · 0.59mm/px · 6 of 22 slices shown (2 of 3)]
[im 1/22]
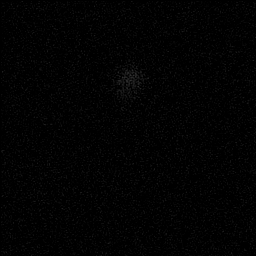
[im 5/22]
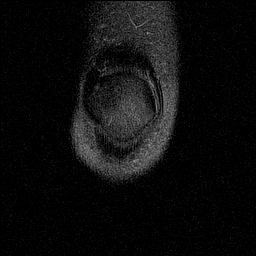
[im 9/22]
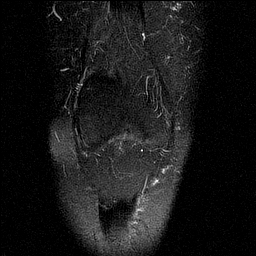
[im 13/22]
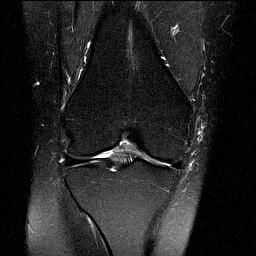
[im 17/22]
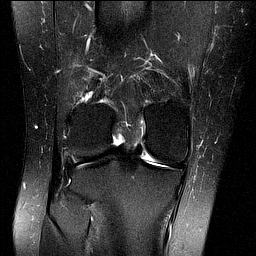
[im 22/22]
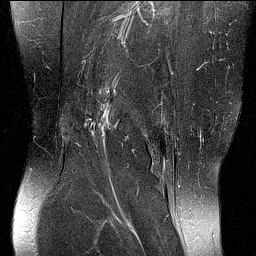

[Series 6: PD fat-sat · coronal · 4.0mm · 0.59mm/px · 6 of 22 slices shown (1 of 2)]
[im 1/22]
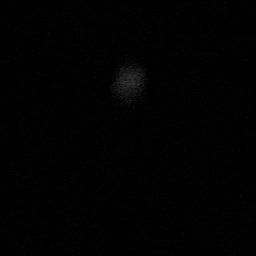
[im 5/22]
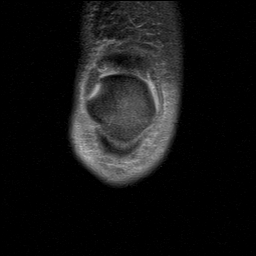
[im 9/22]
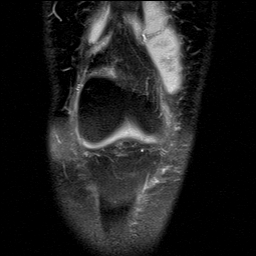
[im 13/22]
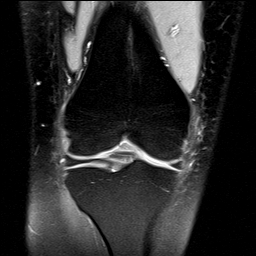
[im 17/22]
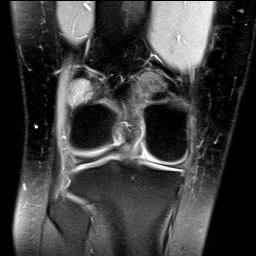
[im 22/22]
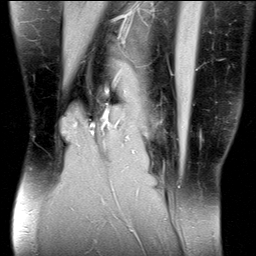

[Series 7: PD fat-sat · sagittal · 3.0mm · 0.59mm/px · 7 of 26 slices shown (2 of 2)]
[im 1/26]
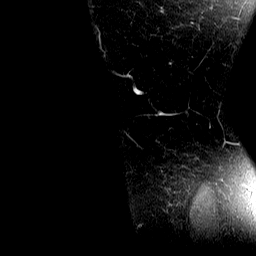
[im 5/26]
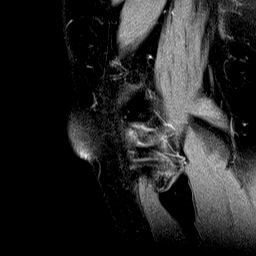
[im 9/26]
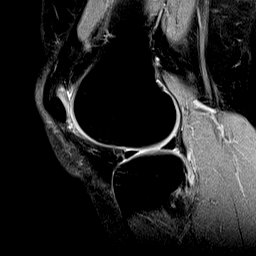
[im 13/26]
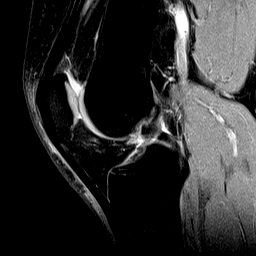
[im 17/26]
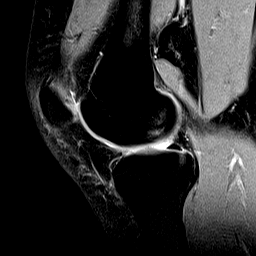
[im 21/26]
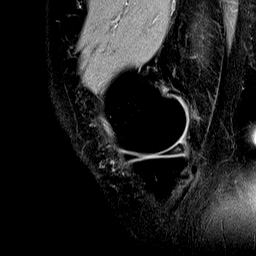
[im 26/26]
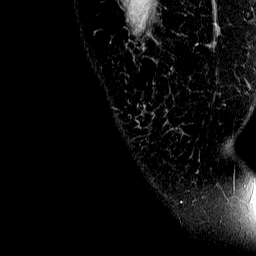

[Series 8: T2 fat-sat · sagittal · 3.0mm · 0.59mm/px · 7 of 26 slices shown (3 of 3)]
[im 1/26]
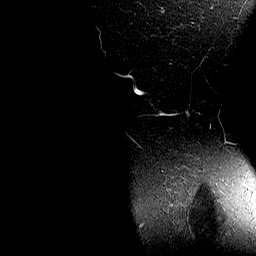
[im 5/26]
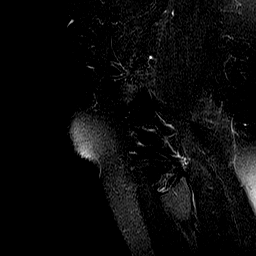
[im 9/26]
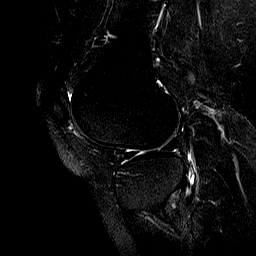
[im 13/26]
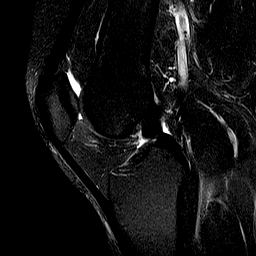
[im 17/26]
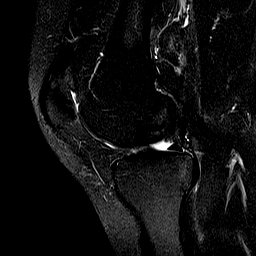
[im 21/26]
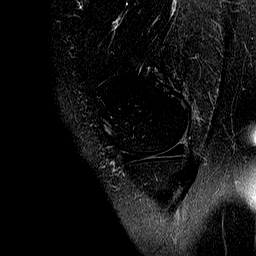
[im 26/26]
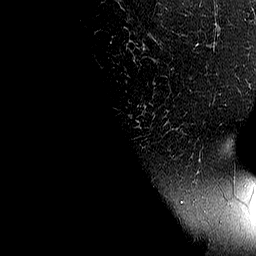

[40 of 40 positions shown; findings below may reference images not displayed]

FINDINGS: MENISCI

Medial meniscus:  Intact.

Lateral meniscus:  Intact.

LIGAMENTS

Cruciates:  Intact ACL and PCL.

Collaterals: Medial collateral ligament is intact. Lateral
collateral ligament complex is intact.

CARTILAGE

Patellofemoral:  No chondral defect.

Medial:  No chondral defect.

Lateral:  No chondral defect.

Joint:  No joint effusion. Normal Hoffa's fat. No plical thickening.

Popliteal Fossa:  No Baker cyst. Intact popliteus tendon.

Extensor Mechanism: Intact quadriceps tendon and patellar tendon.
Intact medial and lateral patellar retinaculum. Intact MPFL.

Bones: No focal marrow signal abnormality. No fracture or
dislocation.

Other: Small ganglion cyst adjacent to the proximal medial
gastrocnemius tendon.
IMPRESSION: 1. No evidence of internal derangement. No significant degenerative
changes.

## 2021-02-22 ENCOUNTER — Ambulatory Visit: Payer: BC Managed Care – PPO | Admitting: Anesthesiology

## 2021-02-22 ENCOUNTER — Other Ambulatory Visit: Payer: Self-pay

## 2021-02-22 ENCOUNTER — Ambulatory Visit
Admission: RE | Admit: 2021-02-22 | Discharge: 2021-02-22 | Disposition: A | Discharge status: Home / Self Care | Payer: BC Managed Care – PPO | Attending: Unknown Physician Specialty | Admitting: Unknown Physician Specialty

## 2021-02-22 ENCOUNTER — Encounter
Admission: RE | Disposition: A | Discharge status: Home / Self Care | Payer: Self-pay | Source: Home / Self Care | Attending: Unknown Physician Specialty

## 2021-02-22 ENCOUNTER — Encounter: Payer: Self-pay | Admitting: Unknown Physician Specialty

## 2021-02-22 DIAGNOSIS — J3489 Other specified disorders of nose and nasal sinuses: Secondary | ICD-10-CM | POA: Diagnosis present

## 2021-02-22 DIAGNOSIS — J343 Hypertrophy of nasal turbinates: Secondary | ICD-10-CM | POA: Insufficient documentation

## 2021-02-22 DIAGNOSIS — J342 Deviated nasal septum: Secondary | ICD-10-CM | POA: Diagnosis not present

## 2021-02-22 HISTORY — DX: Other specified postprocedural states: Z98.890

## 2021-02-22 HISTORY — DX: Motion sickness, initial encounter: T75.3XXA

## 2021-02-22 HISTORY — PX: NASAL SEPTOPLASTY W/ TURBINOPLASTY: SHX2070

## 2021-02-22 HISTORY — DX: Nausea with vomiting, unspecified: R11.2

## 2021-02-22 LAB — POCT PREGNANCY, URINE: Preg Test, Ur: NEGATIVE

## 2021-02-22 SURGERY — SEPTOPLASTY, NOSE, WITH NASAL TURBINATE REDUCTION
Anesthesia: General | Site: Nose | Laterality: Bilateral

## 2021-02-22 MED ORDER — OXYCODONE HCL 5 MG/5ML PO SOLN
5.0000 mg | Freq: Once | ORAL | Status: AC | PRN
  Administered 2021-02-22: 5 mg via ORAL

## 2021-02-22 MED ORDER — LIDOCAINE HCL (CARDIAC) PF 100 MG/5ML IV SOSY
PREFILLED_SYRINGE | INTRAVENOUS | Status: DC | PRN
  Administered 2021-02-22: 50 mg via INTRAVENOUS

## 2021-02-22 MED ORDER — OXYMETAZOLINE HCL 0.05 % NA SOLN: 6.0000 | Freq: Once | NASAL | Status: DC

## 2021-02-22 MED ORDER — KETOROLAC TROMETHAMINE 30 MG/ML IJ SOLN: 15.0000 mg | Freq: Once | INTRAMUSCULAR | Status: DC | PRN

## 2021-02-22 MED ORDER — PROPOFOL 10 MG/ML IV BOLUS
INTRAVENOUS | Status: DC | PRN
  Administered 2021-02-22: 150 mg via INTRAVENOUS

## 2021-02-22 MED ORDER — FENTANYL CITRATE PF 50 MCG/ML IJ SOSY
25.0000 ug | PREFILLED_SYRINGE | INTRAMUSCULAR | Status: DC | PRN
  Administered 2021-02-22 (×2): 50 ug via INTRAVENOUS

## 2021-02-22 MED ORDER — DEXAMETHASONE SODIUM PHOSPHATE 4 MG/ML IJ SOLN
INTRAMUSCULAR | Status: DC | PRN
  Administered 2021-02-22: 10 mg via INTRAVENOUS

## 2021-02-22 MED ORDER — SUCCINYLCHOLINE CHLORIDE 200 MG/10ML IV SOSY
PREFILLED_SYRINGE | INTRAVENOUS | Status: DC | PRN
  Administered 2021-02-22: 100 mg via INTRAVENOUS

## 2021-02-22 MED ORDER — ONDANSETRON HCL 4 MG/2ML IJ SOLN: 4.0000 mg | Freq: Once | INTRAMUSCULAR | Status: DC | PRN

## 2021-02-22 MED ORDER — LIDOCAINE-EPINEPHRINE 1 %-1:100000 IJ SOLN
INTRAMUSCULAR | Status: DC | PRN
  Administered 2021-02-22: 12 mL

## 2021-02-22 MED ORDER — ONDANSETRON HCL 4 MG/2ML IJ SOLN
INTRAMUSCULAR | Status: DC | PRN
Start: 2021-02-22 — End: 2021-02-22
  Administered 2021-02-22: 4 mg via INTRAVENOUS

## 2021-02-22 MED ORDER — FENTANYL CITRATE (PF) 100 MCG/2ML IJ SOLN
INTRAMUSCULAR | Status: DC | PRN
  Administered 2021-02-22: 100 ug via INTRAVENOUS

## 2021-02-22 MED ORDER — MIDAZOLAM HCL 5 MG/5ML IJ SOLN
INTRAMUSCULAR | Status: DC | PRN
  Administered 2021-02-22: 2 mg via INTRAVENOUS

## 2021-02-22 MED ORDER — PHENYLEPHRINE HCL 0.5 % NA SOLN: NASAL | Status: DC | PRN

## 2021-02-22 MED ORDER — GLYCOPYRROLATE 0.2 MG/ML IJ SOLN
INTRAMUSCULAR | Status: DC | PRN
  Administered 2021-02-22: .1 mg via INTRAVENOUS

## 2021-02-22 MED ORDER — SULFAMETHOXAZOLE-TRIMETHOPRIM 800-160 MG PO TABS: 1.0000 | ORAL_TABLET | Freq: Two times a day (BID) | ORAL | 0 refills | Status: AC

## 2021-02-22 MED ORDER — HYDROCODONE-ACETAMINOPHEN 5-300 MG PO TABS: 1.0000 | ORAL_TABLET | ORAL | 0 refills | Status: AC | PRN

## 2021-02-22 MED ORDER — OXYCODONE HCL 5 MG PO TABS: 5.0000 mg | ORAL_TABLET | Freq: Once | ORAL | Status: AC | PRN

## 2021-02-22 MED ORDER — SCOPOLAMINE 1 MG/3DAYS TD PT72
1.0000 | MEDICATED_PATCH | TRANSDERMAL | Status: DC
  Administered 2021-02-22: 1.5 mg via TRANSDERMAL

## 2021-02-22 MED ORDER — LACTATED RINGERS IV SOLN: INTRAVENOUS | Status: DC

## 2021-02-22 MED ORDER — ACETAMINOPHEN 10 MG/ML IV SOLN
1000.0000 mg | Freq: Once | INTRAVENOUS | Status: AC
  Administered 2021-02-22: 1000 mg via INTRAVENOUS

## 2021-02-22 SURGICAL SUPPLY — 22 items
COAG SUCT 10F 3.5MM HAND CTRL (MISCELLANEOUS) ×2 IMPLANT
DRAPE HEAD BAR (DRAPES) ×2 IMPLANT
DRESSING NASL FOAM PST OP SINU (MISCELLANEOUS) IMPLANT
DRSG NASAL FOAM POST OP SINU (MISCELLANEOUS) ×4
ELECT REM PT RETURN 9FT ADLT (ELECTROSURGICAL) ×2
ELECTRODE REM PT RTRN 9FT ADLT (ELECTROSURGICAL) ×1 IMPLANT
GLOVE SURG ENC MOIS LTX SZ7.5 (GLOVE) ×4 IMPLANT
HANDLE YANKAUER SUCT BULB TIP (MISCELLANEOUS) ×2 IMPLANT
KIT TURNOVER KIT A (KITS) ×2 IMPLANT
NDL HYPO 25GX1X1/2 BEV (NEEDLE) ×1 IMPLANT
NEEDLE HYPO 25GX1X1/2 BEV (NEEDLE) ×2 IMPLANT
PACK ENT CUSTOM (PACKS) ×2 IMPLANT
SPLINT NASAL SEPTAL BLV .50 ST (MISCELLANEOUS) ×1 IMPLANT
SPONGE NEURO XRAY DETECT 1X3 (DISPOSABLE) ×2 IMPLANT
STRAP BODY AND KNEE 60X3 (MISCELLANEOUS) ×2 IMPLANT
SUT CHROMIC 3-0 (SUTURE) ×2
SUT CHROMIC 3-0 KS 27XMFL CR (SUTURE) ×1
SUT ETHILON 3-0 KS 30 BLK (SUTURE) ×2 IMPLANT
SUTURE CHRMC 3-0 KS 27XMFL CR (SUTURE) ×1 IMPLANT
SYR 10ML LL (SYRINGE) ×2 IMPLANT
TOWEL OR 17X26 4PK STRL BLUE (TOWEL DISPOSABLE) ×2 IMPLANT
WATER STERILE IRR 250ML POUR (IV SOLUTION) ×2 IMPLANT

## 2021-02-22 NOTE — Anesthesia Postprocedure Evaluation (Signed)
Anesthesia Post Note  Patient: Valerie Valdez  Procedure(s) Performed: NASAL SEPTOPLASTY WITH SUBMUCOUS TURBINATE REDUCTION (Bilateral: Nose)     Patient location during evaluation: PACU Anesthesia Type: General Level of consciousness: awake and alert Pain management: pain level controlled Vital Signs Assessment: post-procedure vital signs reviewed and stable Respiratory status: spontaneous breathing, nonlabored ventilation, respiratory function stable and patient connected to nasal cannula oxygen Cardiovascular status: blood pressure returned to baseline and stable Postop Assessment: no apparent nausea or vomiting Anesthetic complications: no   No notable events documented.  Sinda Du

## 2021-02-22 NOTE — Anesthesia Procedure Notes (Addendum)
Procedure Name: Intubation Date/Time: 02/22/2021 9:45 AM Performed by: Mayme Genta, CRNA Pre-anesthesia Checklist: Patient identified, Emergency Drugs available, Suction available, Patient being monitored and Timeout performed Patient Re-evaluated:Patient Re-evaluated prior to induction Oxygen Delivery Method: Circle system utilized Preoxygenation: Pre-oxygenation with 100% oxygen Induction Type: IV induction Ventilation: Mask ventilation without difficulty Laryngoscope Size: Mac and 3 Grade View: Grade I Tube type: Oral Rae Tube size: 7.0 mm Number of attempts: 1 Placement Confirmation: ETT inserted through vocal cords under direct vision, positive ETCO2 and breath sounds checked- equal and bilateral Tube secured with: Tape Dental Injury: Teeth and Oropharynx as per pre-operative assessment

## 2021-02-22 NOTE — Anesthesia Preprocedure Evaluation (Signed)
Anesthesia Evaluation  Patient identified by MRN, date of birth, ID band Patient awake    Reviewed: Allergy & Precautions, H&P , NPO status , Patient's Chart, lab work & pertinent test results  History of Anesthesia Complications (+) PONV and history of anesthetic complications  Airway Mallampati: II  TM Distance: >3 FB     Dental no notable dental hx. (+) Teeth Intact   Pulmonary neg pulmonary ROS, neg sleep apnea, neg COPD,    Pulmonary exam normal breath sounds clear to auscultation       Cardiovascular (-) angina(-) Past MI and (-) Cardiac Stents negative cardio ROS Normal cardiovascular exam(-) dysrhythmias  Rhythm:regular Rate:Normal     Neuro/Psych negative neurological ROS  negative psych ROS   GI/Hepatic Neg liver ROS, GERD  Controlled and Medicated,  Endo/Other  negative endocrine ROS  Renal/GU      Musculoskeletal   Abdominal Normal abdominal exam  (+) - obese,   Peds  Hematology negative hematology ROS (+)   Anesthesia Other Findings Past Medical History: 2012: Ectopic pregnancy of left ovary No date: GERD (gastroesophageal reflux disease) No date: Insomnia No date: Psoriasis  Past Surgical History: 2015: ABDOMINOPLASTY No date: APPENDECTOMY 2015: BREAST SURGERY; Bilateral     Comment:  breast lift 2007, 2009, 2011: CESAREAN SECTION 2009: COLONOSCOPY 2012: LAPAROSCOPIC BILATERAL SALPINGECTOMY 2015: MASTOPEXY No date: TUBAL LIGATION  BMI    Body Mass Index: 29.18 kg/m      Reproductive/Obstetrics negative OB ROS                             Anesthesia Physical  Anesthesia Plan  ASA: II  Anesthesia Plan: General ETT   Post-op Pain Management:    Induction: Intravenous  PONV Risk Score and Plan: 4 or greater and Ondansetron, Dexamethasone, Midazolam, Treatment may vary due to age or medical condition, Propofol infusion and TIVA  Airway Management  Planned: Oral ETT  Additional Equipment:   Intra-op Plan:   Post-operative Plan:   Informed Consent: I have reviewed the patients History and Physical, chart, labs and discussed the procedure including the risks, benefits and alternatives for the proposed anesthesia with the patient or authorized representative who has indicated his/her understanding and acceptance.     Dental Advisory Given  Plan Discussed with: CRNA, Anesthesiologist and Surgeon  Anesthesia Plan Comments:         Anesthesia Quick Evaluation  Patient Active Problem List   Diagnosis Date Noted  . Varicose veins with inflammation 01/20/2021  . Leg pain 01/20/2021  . Leg swelling 01/20/2021  . Mass of hand 11/08/2020  . Pain in right hand 11/08/2020  . S/P laparoscopic hysterectomy 09/13/2020  . Abnormal uterine bleeding (AUB)   . Intramural and subserous leiomyoma of uterus   . Right shoulder strain 06/08/2017  . Rotator cuff tendinitis, right 06/08/2017  . Superior glenoid labrum lesion of right shoulder 06/08/2017    CBC Latest Ref Rng & Units 09/07/2020  WBC 4.0 - 10.5 K/uL 6.3  Hemoglobin 12.0 - 15.0 g/dL 12.5  Hematocrit 36.0 - 46.0 % 37.2  Platelets 150 - 400 K/uL 277   BMP Latest Ref Rng & Units 09/07/2020  Glucose 70 - 99 mg/dL 95  BUN 6 - 20 mg/dL 9  Creatinine 0.44 - 1.00 mg/dL 0.66  Sodium 135 - 145 mmol/L 137  Potassium 3.5 - 5.1 mmol/L 3.8  Chloride 98 - 111 mmol/L 106  CO2 22 - 32 mmol/L 26  Calcium 8.9 - 10.3 mg/dL 9.4    Risks and benefits of anesthesia discussed at length, patient or surrogate demonstrates understanding. Appropriately NPO. Plan to proceed with anesthesia.  Champ Mungo, MD 02/22/21

## 2021-02-22 NOTE — Op Note (Signed)
PREOPERATIVE DIAGNOSIS:  Chronic nasal obstruction.  POSTOPERATIVE DIAGNOSIS:  Chronic nasal obstruction.  SURGEON:  Roena Malady, M.D.  NAME OF PROCEDURE:  Nasal septoplasty. Submucous resection of inferior turbinates.  OPERATIVE FINDINGS:  Severe nasal septal deformity, hypertrophy of the inferior turbinates.   DESCRIPTION OF THE PROCEDURE:  Valerie Valdez was identified in the holding area and taken to the operating room and placed in the supine position.  After general endotracheal anesthesia was induced, the table was turned 45 degrees and the patient was placed in a semi-Fowler position.  The nose was then topically anesthetized with Lidocaine, cotton pledgets were placed within each nostril. After approximately 5 minutes, this was removed at which time a local anesthetic of 1% Lidocaine 1:100,000 units of Epinephrine was used to inject the inferior turbinates in the nasal septum. A total of 12 ml was used. Examination of the nose showed a severe left nasal septal deformity and tremendous hypertrophied inferior turbinate.  Beginning on the right hand side a hemitransfixion incision was then created on the leading edge of the septum on the right.  A subperichondrial plane was elevated posteriorly on the left and taken back to the perpendicular plate of the ethmoid where subperiosteal plane was elevated posteriorly on the left. A large septal spur was identified on the left hand side.  An inferior rim of cartilage was removed anteriorly with care taken to leave an anterior strut to prevent nasal collapse. With this strut removed the perpendicular plate of the ethmoid was separated from the quadrangular cartilage. The large septal spur was removed.  The septum was then replaced in the midline. Reinspection through each nostril showed excellent reduction of the septal deformity. A left posterior inferior fenestration was then created to allow hematoma drainage.  With the septoplasty completed,  beginning on the left-hand side, a 15 blade was used to incise along the inferior edge of the inferior turbinate. A superior laterally based flap was then elevated. The underlying conchal bone of mucosa was excised using Knight scissors. The flap was then laid back over the turbinate stump and cauterized using suction cautery. In a similar fashion the submucous resection was performed on the right.  With the submucous resection completed bilaterally and no active bleeding, the hemitransfixion incision was then closed using two interrupted 3-0 chromic sutures.  Plastic nasal septal splints were placed within each nostril and affixed to the septum using a 3-0 nylon suture. Stammberger was then used beneath each inferior turbinate for hemostasis.    The patient tolerated the procedure well, was returned to anesthesia, extubated in the operating room, and taken to the recovery room in stable condition.    CULTURES:  None.  SPECIMENS:  None.  ESTIMATED BLOOD LOSS:  25 cc.  Roena Malady  02/22/2021  10:22 AM

## 2021-02-22 NOTE — H&P (Signed)
The patient's history has been reviewed, patient examined, no change in status, stable for surgery.  Questions were answered to the patients satisfaction.  

## 2021-02-22 NOTE — Transfer of Care (Signed)
Immediate Anesthesia Transfer of Care Note  Patient: Valerie Valdez  Procedure(s) Performed: NASAL SEPTOPLASTY WITH SUBMUCOUS TURBINATE REDUCTION (Bilateral: Nose)  Patient Location: PACU  Anesthesia Type: General ETT  Level of Consciousness: awake, alert  and patient cooperative  Airway and Oxygen Therapy: Patient Spontanous Breathing and Patient connected to supplemental oxygen  Post-op Assessment: Post-op Vital signs reviewed, Patient's Cardiovascular Status Stable, Respiratory Function Stable, Patent Airway and No signs of Nausea or vomiting  Post-op Vital Signs: Reviewed and stable  Complications: No notable events documented.

## 2021-02-25 ENCOUNTER — Encounter: Payer: Self-pay | Admitting: Unknown Physician Specialty

## 2021-10-09 ENCOUNTER — Other Ambulatory Visit: Payer: Self-pay | Admitting: Orthopedic Surgery

## 2021-10-09 DIAGNOSIS — M2392 Unspecified internal derangement of left knee: Secondary | ICD-10-CM

## 2021-10-09 DIAGNOSIS — S83422A Sprain of lateral collateral ligament of left knee, initial encounter: Secondary | ICD-10-CM

## 2021-10-18 ENCOUNTER — Ambulatory Visit
Admission: RE | Admit: 2021-10-18 | Discharge: 2021-10-18 | Disposition: A | Discharge status: Home / Self Care | Payer: No Typology Code available for payment source | Source: Ambulatory Visit | Attending: Orthopedic Surgery | Admitting: Orthopedic Surgery

## 2021-10-18 DIAGNOSIS — M2392 Unspecified internal derangement of left knee: Secondary | ICD-10-CM | POA: Diagnosis present

## 2021-10-18 DIAGNOSIS — S83422A Sprain of lateral collateral ligament of left knee, initial encounter: Secondary | ICD-10-CM | POA: Insufficient documentation

## 2021-10-18 IMAGING — MR MR KNEE*L* W/O CM
6 series · 40 of 40 positions shown · non-contrast
Comparison: None available

CLINICAL DATA: Physical education teacher. Kids fell on patient 4
months ago. Constant lateral knee pain that radiates down the leg.

EXAM:
MRI OF THE LEFT KNEE WITHOUT CONTRAST
TECHNIQUE: Multiplanar, multisequence MR imaging of the knee was performed. No
intravenous contrast was administered.

[Series 8: T2 fat-sat · axial · left · 4.0mm · 0.50mm/px · z∈[-98,+27]mm · 6 of 26 slices shown (1 of 3)]
[im 1/26]
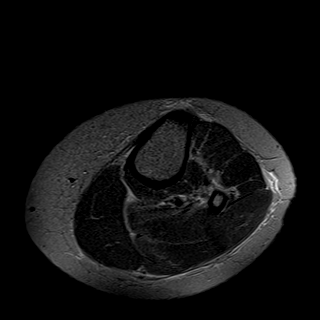
[im 6/26]
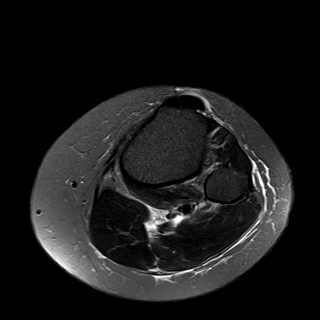
[im 11/26]
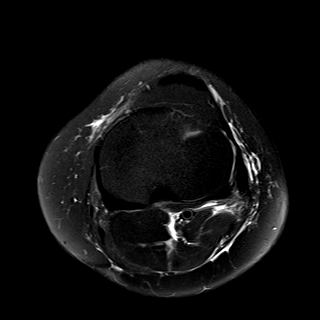
[im 16/26]
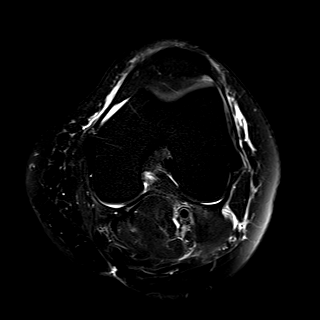
[im 21/26]
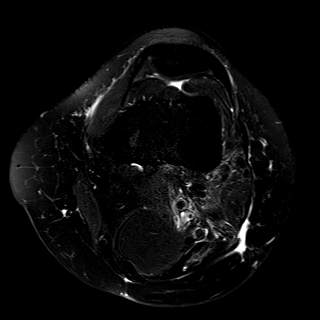
[im 26/26]
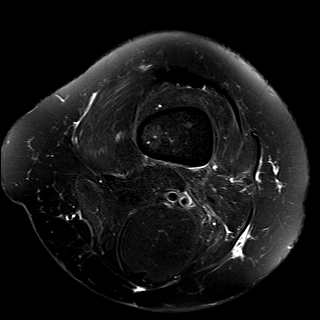

[Series 9: T2 fat-sat · coronal · left · 4.0mm · 0.59mm/px · 6 of 30 slices shown (2 of 3)]
[im 1/30]
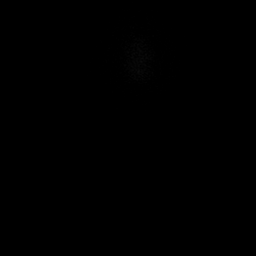
[im 6/30]
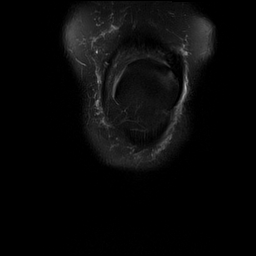
[im 12/30]
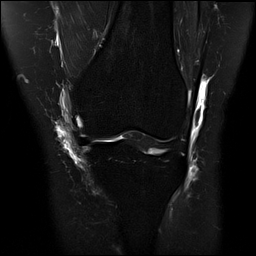
[im 18/30]
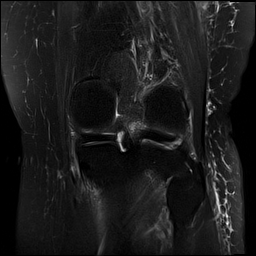
[im 24/30]
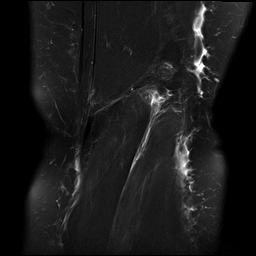
[im 30/30]
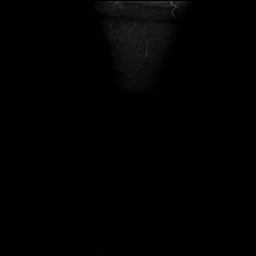

[Series 10: T1 · coronal · left · 4.0mm · 0.59mm/px · 6 of 30 slices shown]
[im 1/30]
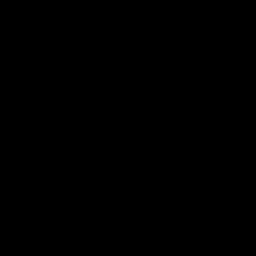
[im 6/30]
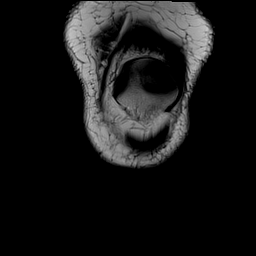
[im 12/30]
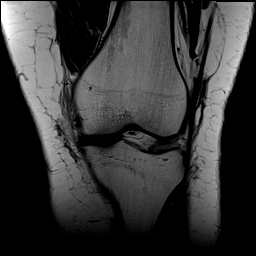
[im 18/30]
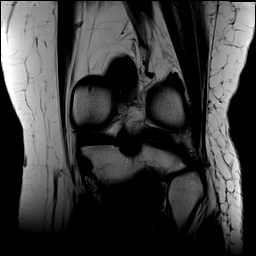
[im 24/30]
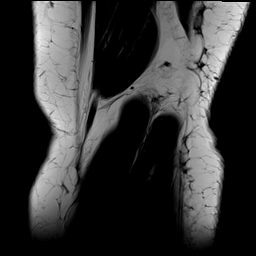
[im 30/30]
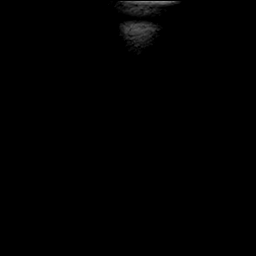

[Series 11: PD fat-sat · sagittal · left · 3.0mm · 0.59mm/px · 8 of 39 slices shown (1 of 2)]
[im 1/39]
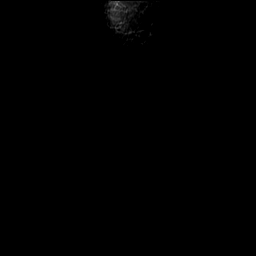
[im 6/39]
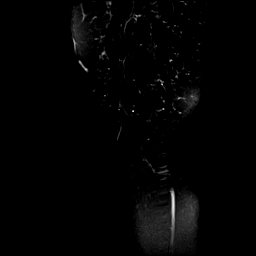
[im 11/39]
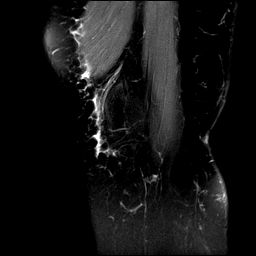
[im 17/39]
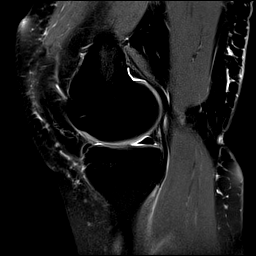
[im 22/39]
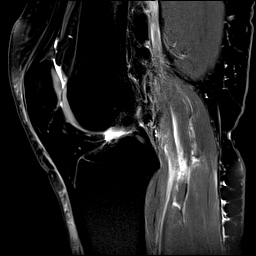
[im 28/39]
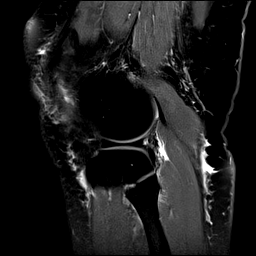
[im 33/39]
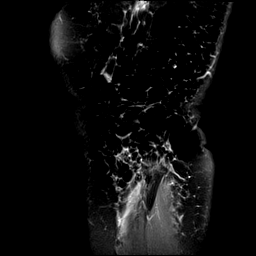
[im 39/39]
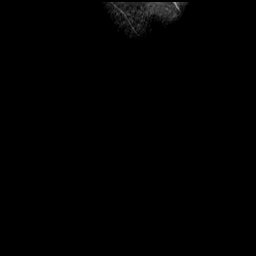

[Series 12: PD fat-sat · coronal · left · 4.0mm · 0.59mm/px · 6 of 30 slices shown (2 of 2)]
[im 1/30]
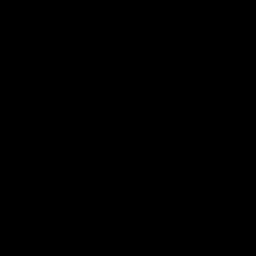
[im 6/30]
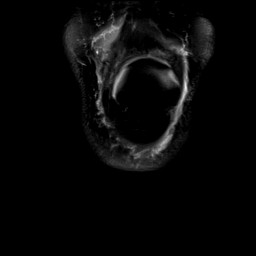
[im 12/30]
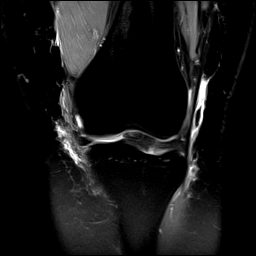
[im 18/30]
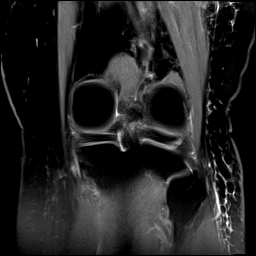
[im 24/30]
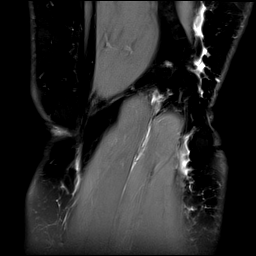
[im 30/30]
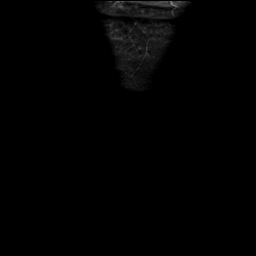

[Series 13: T2 fat-sat · sagittal · left · 3.0mm · 0.59mm/px · 8 of 40 slices shown (3 of 3)]
[im 1/40]
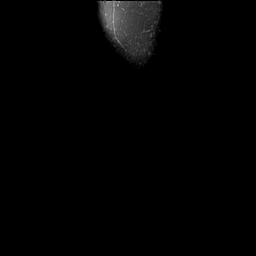
[im 6/40]
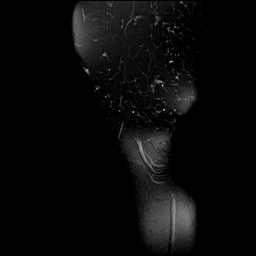
[im 12/40]
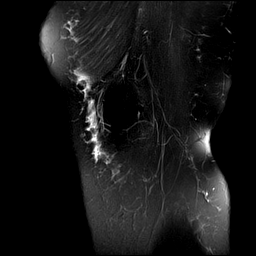
[im 17/40]
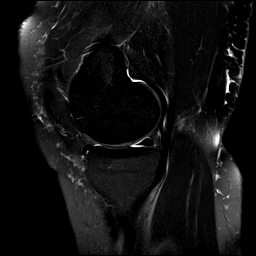
[im 23/40]
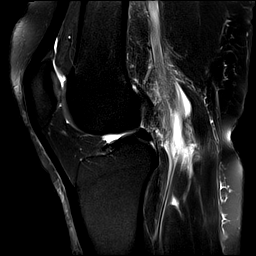
[im 28/40]
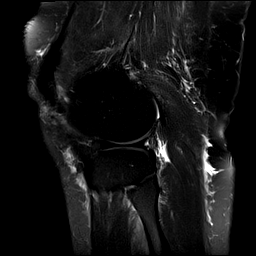
[im 34/40]
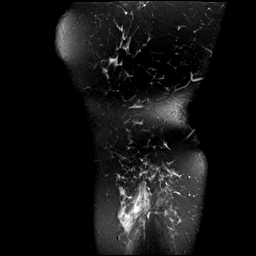
[im 40/40]
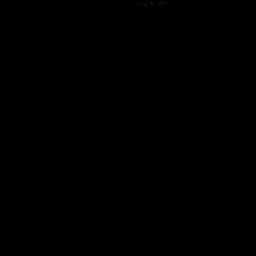

[40 of 40 positions shown; findings below may reference images not displayed]

FINDINGS: MENISCI

Medial meniscus: Mild intermediate proton density signal
intrasubstance degeneration within the junction of body and
posterior horn of the medial meniscus. No tear is seen extending
through the articular surface of the medial meniscus.

Lateral meniscus:  Intact.

LIGAMENTS

Cruciates: The ACL and PCL are intact.

Collaterals: The medial collateral ligament is intact. The fibular
collateral ligament, biceps femoris tendon, iliotibial band, and
popliteus tendon are intact.

CARTILAGE

Patellofemoral: Minimal curvilinear intermediate T2 signal within
the junction of the patellar apex and lateral patellar facet mid
height (axial image 7). No fluid bright cartilage defect. The medial
and lateral trochlear cartilage is intact.

Medial:  Intact.

Lateral:  Intact.

Joint: Nojoint effusion. Normal Hoffa's fat pad. No plical
thickening. The tibial tuberosity-trochlear groove distance measures
8 mm, within normal limits.

Popliteal Fossa:  No Baker's cyst.

Extensor Mechanism:  Intact quadriceps tendon and patellar tendon.

Bones:  No acute fracture or dislocation.

Other: There is mild fluid along the fascial planes in between the
popliteus, medial head of the gastrocnemius, and lateral head of the
gastrocnemius muscles along the posterolateral knee (axial series 8
images 13 through 22). There is also mild fluid just posterior to
the distal biceps femoris tendon (axial images 8 through 15). Mild
edema within the posterolateral knee subcutaneous fat especially at
the deep aspect. Minimal edema within the visualized proximal aspect
of the lateral head of the gastrocnemius muscle.
IMPRESSION: :
IMPRESSION: 1. There is mild fluid tracking along the fascial planes between the
popliteus and medial head of the gastrocnemius, and lateral head of
the gastrocnemius muscles. Mild edema throughout the visualized
proximal lateral head of the gastrocnemius muscle, likely a grade 1
strain.
2. Intact cruciate ligaments, collateral ligaments, and menisci.

## 2022-01-06 ENCOUNTER — Other Ambulatory Visit: Payer: Self-pay | Admitting: Family Medicine

## 2022-01-06 DIAGNOSIS — Z1231 Encounter for screening mammogram for malignant neoplasm of breast: Secondary | ICD-10-CM

## 2022-01-28 ENCOUNTER — Ambulatory Visit
Admission: RE | Admit: 2022-01-28 | Discharge: 2022-01-28 | Disposition: A | Discharge status: Home / Self Care | Payer: BC Managed Care – PPO | Source: Ambulatory Visit | Attending: Family Medicine | Admitting: Family Medicine

## 2022-01-28 DIAGNOSIS — Z1231 Encounter for screening mammogram for malignant neoplasm of breast: Secondary | ICD-10-CM | POA: Diagnosis present

## 2022-01-31 ENCOUNTER — Other Ambulatory Visit: Payer: Self-pay | Admitting: Family Medicine

## 2022-01-31 DIAGNOSIS — R928 Other abnormal and inconclusive findings on diagnostic imaging of breast: Secondary | ICD-10-CM

## 2022-01-31 DIAGNOSIS — N6489 Other specified disorders of breast: Secondary | ICD-10-CM

## 2022-01-31 DIAGNOSIS — N63 Unspecified lump in unspecified breast: Secondary | ICD-10-CM

## 2022-02-12 ENCOUNTER — Ambulatory Visit
Admission: RE | Admit: 2022-02-12 | Discharge: 2022-02-12 | Disposition: A | Discharge status: Home / Self Care | Payer: BC Managed Care – PPO | Source: Ambulatory Visit | Attending: Family Medicine | Admitting: Family Medicine

## 2022-02-12 DIAGNOSIS — R928 Other abnormal and inconclusive findings on diagnostic imaging of breast: Secondary | ICD-10-CM

## 2022-02-12 DIAGNOSIS — N6489 Other specified disorders of breast: Secondary | ICD-10-CM

## 2022-02-12 DIAGNOSIS — N63 Unspecified lump in unspecified breast: Secondary | ICD-10-CM | POA: Insufficient documentation

## 2022-02-19 ENCOUNTER — Other Ambulatory Visit: Payer: BC Managed Care – PPO

## 2022-02-20 ENCOUNTER — Other Ambulatory Visit: Payer: Self-pay | Admitting: Family Medicine

## 2022-02-20 DIAGNOSIS — R928 Other abnormal and inconclusive findings on diagnostic imaging of breast: Secondary | ICD-10-CM

## 2022-02-20 DIAGNOSIS — N63 Unspecified lump in unspecified breast: Secondary | ICD-10-CM

## 2022-03-05 ENCOUNTER — Ambulatory Visit
Admission: RE | Admit: 2022-03-05 | Discharge: 2022-03-05 | Disposition: A | Discharge status: Home / Self Care | Payer: BC Managed Care – PPO | Source: Ambulatory Visit | Attending: Family Medicine | Admitting: Family Medicine

## 2022-03-05 DIAGNOSIS — R928 Other abnormal and inconclusive findings on diagnostic imaging of breast: Secondary | ICD-10-CM | POA: Insufficient documentation

## 2022-03-05 DIAGNOSIS — N63 Unspecified lump in unspecified breast: Secondary | ICD-10-CM

## 2022-03-06 LAB — SURGICAL PATHOLOGY

## 2022-05-12 ENCOUNTER — Encounter (INDEPENDENT_AMBULATORY_CARE_PROVIDER_SITE_OTHER): Payer: Self-pay

## 2022-12-30 ENCOUNTER — Other Ambulatory Visit: Payer: Self-pay | Admitting: Family Medicine

## 2022-12-30 DIAGNOSIS — Z1231 Encounter for screening mammogram for malignant neoplasm of breast: Secondary | ICD-10-CM

## 2023-02-03 ENCOUNTER — Ambulatory Visit
Admission: RE | Admit: 2023-02-03 | Discharge: 2023-02-03 | Disposition: A | Discharge status: Home / Self Care | Payer: BC Managed Care – PPO | Source: Ambulatory Visit | Attending: Family Medicine | Admitting: Family Medicine

## 2023-02-03 DIAGNOSIS — Z1231 Encounter for screening mammogram for malignant neoplasm of breast: Secondary | ICD-10-CM | POA: Diagnosis not present

## 2024-01-04 ENCOUNTER — Other Ambulatory Visit: Payer: Self-pay | Admitting: Family Medicine

## 2024-01-04 DIAGNOSIS — Z1231 Encounter for screening mammogram for malignant neoplasm of breast: Secondary | ICD-10-CM

## 2024-02-04 ENCOUNTER — Ambulatory Visit
Admission: RE | Admit: 2024-02-04 | Discharge: 2024-02-04 | Disposition: A | Discharge status: Home / Self Care | Payer: Self-pay | Source: Ambulatory Visit | Attending: Family Medicine | Admitting: Family Medicine

## 2024-02-04 DIAGNOSIS — Z1231 Encounter for screening mammogram for malignant neoplasm of breast: Secondary | ICD-10-CM | POA: Insufficient documentation
